# Patient Record
Sex: Male | Born: 1980
Health system: Southern US, Community
[De-identification: ages and names within clinical notes are randomized; demographics above are authoritative.]

## PROBLEM LIST (undated history)

## (undated) DIAGNOSIS — F5101 Primary insomnia: Secondary | ICD-10-CM

## (undated) DIAGNOSIS — R0602 Shortness of breath: Secondary | ICD-10-CM

## (undated) DIAGNOSIS — M4712 Other spondylosis with myelopathy, cervical region: Secondary | ICD-10-CM

## (undated) DIAGNOSIS — M4722 Other spondylosis with radiculopathy, cervical region: Secondary | ICD-10-CM

## (undated) DIAGNOSIS — F411 Generalized anxiety disorder: Secondary | ICD-10-CM

## (undated) HISTORY — DX: Primary insomnia: F51.01

## (undated) HISTORY — DX: Shortness of breath: R06.02

## (undated) HISTORY — DX: Other spondylosis with myelopathy, cervical region: M47.12

## (undated) HISTORY — DX: Generalized anxiety disorder: F41.1

## (undated) HISTORY — DX: Other spondylosis with radiculopathy, cervical region: M47.22

---

## 2008-11-28 ENCOUNTER — Emergency Department (HOSPITAL_COMMUNITY): Admission: EM | Admit: 2008-11-28 | Discharge: 2008-11-28 | Payer: Self-pay | Admitting: Emergency Medicine

## 2009-11-30 ENCOUNTER — Emergency Department (HOSPITAL_COMMUNITY): Admission: EM | Admit: 2009-11-30 | Discharge: 2009-11-30 | Payer: Self-pay | Admitting: Emergency Medicine

## 2012-06-30 ENCOUNTER — Ambulatory Visit
Admission: RE | Admit: 2012-06-30 | Discharge: 2012-06-30 | Disposition: A | Discharge status: Home / Self Care | Payer: BC Managed Care – PPO | Source: Ambulatory Visit | Attending: Family Medicine | Admitting: Family Medicine

## 2012-06-30 ENCOUNTER — Other Ambulatory Visit: Payer: Self-pay | Admitting: Family Medicine

## 2012-06-30 DIAGNOSIS — M549 Dorsalgia, unspecified: Secondary | ICD-10-CM

## 2012-10-08 ENCOUNTER — Other Ambulatory Visit (HOSPITAL_COMMUNITY): Payer: Self-pay | Admitting: Orthopedic Surgery

## 2012-10-08 DIAGNOSIS — IMO0001 Reserved for inherently not codable concepts without codable children: Secondary | ICD-10-CM

## 2012-10-08 DIAGNOSIS — G950 Syringomyelia and syringobulbia: Secondary | ICD-10-CM

## 2012-10-13 ENCOUNTER — Ambulatory Visit (HOSPITAL_COMMUNITY)
Admission: RE | Admit: 2012-10-13 | Discharge: 2012-10-13 | Disposition: A | Discharge status: Home / Self Care | Payer: Self-pay | Source: Ambulatory Visit | Attending: Orthopedic Surgery | Admitting: Orthopedic Surgery

## 2012-10-13 DIAGNOSIS — M519 Unspecified thoracic, thoracolumbar and lumbosacral intervertebral disc disorder: Secondary | ICD-10-CM | POA: Insufficient documentation

## 2012-10-13 DIAGNOSIS — IMO0001 Reserved for inherently not codable concepts without codable children: Secondary | ICD-10-CM

## 2012-10-13 DIAGNOSIS — M47812 Spondylosis without myelopathy or radiculopathy, cervical region: Secondary | ICD-10-CM | POA: Insufficient documentation

## 2012-10-13 DIAGNOSIS — G950 Syringomyelia and syringobulbia: Secondary | ICD-10-CM

## 2012-10-13 DIAGNOSIS — G9589 Other specified diseases of spinal cord: Secondary | ICD-10-CM | POA: Insufficient documentation

## 2013-02-22 DIAGNOSIS — M47812 Spondylosis without myelopathy or radiculopathy, cervical region: Secondary | ICD-10-CM | POA: Insufficient documentation

## 2017-03-09 ENCOUNTER — Encounter: Payer: Self-pay | Admitting: Physician Assistant

## 2017-03-09 ENCOUNTER — Ambulatory Visit (INDEPENDENT_AMBULATORY_CARE_PROVIDER_SITE_OTHER): Payer: 59 | Admitting: Physician Assistant

## 2017-03-09 VITALS — BP 146/96 | HR 97 | Ht 74.0 in | Wt 245.0 lb

## 2017-03-09 DIAGNOSIS — M4712 Other spondylosis with myelopathy, cervical region: Secondary | ICD-10-CM | POA: Diagnosis not present

## 2017-03-09 DIAGNOSIS — R030 Elevated blood-pressure reading, without diagnosis of hypertension: Secondary | ICD-10-CM | POA: Diagnosis not present

## 2017-03-09 DIAGNOSIS — M4802 Spinal stenosis, cervical region: Secondary | ICD-10-CM | POA: Diagnosis not present

## 2017-03-09 DIAGNOSIS — F5101 Primary insomnia: Secondary | ICD-10-CM

## 2017-03-09 DIAGNOSIS — F411 Generalized anxiety disorder: Secondary | ICD-10-CM

## 2017-03-09 DIAGNOSIS — R0602 Shortness of breath: Secondary | ICD-10-CM

## 2017-03-09 DIAGNOSIS — G4700 Insomnia, unspecified: Secondary | ICD-10-CM | POA: Insufficient documentation

## 2017-03-09 DIAGNOSIS — M4722 Other spondylosis with radiculopathy, cervical region: Secondary | ICD-10-CM

## 2017-03-09 HISTORY — DX: Generalized anxiety disorder: F41.1

## 2017-03-09 HISTORY — DX: Shortness of breath: R06.02

## 2017-03-09 HISTORY — DX: Other spondylosis with myelopathy, cervical region: M47.12

## 2017-03-09 HISTORY — DX: Primary insomnia: F51.01

## 2017-03-09 MED ORDER — FLUOXETINE HCL 20 MG PO TABS: 20.0000 mg | ORAL_TABLET | Freq: Every day | ORAL | 1 refills | Status: DC

## 2017-03-09 MED ORDER — TRAZODONE HCL 50 MG PO TABS: 25.0000 mg | ORAL_TABLET | Freq: Every evening | ORAL | 1 refills | Status: DC | PRN

## 2017-03-09 MED ORDER — TRAMADOL HCL 50 MG PO TABS: 100.0000 mg | ORAL_TABLET | Freq: Two times a day (BID) | ORAL | 2 refills | Status: DC | PRN

## 2017-03-09 MED ORDER — MELOXICAM 15 MG PO TABS: 15.0000 mg | ORAL_TABLET | Freq: Every day | ORAL | 2 refills | Status: DC

## 2017-03-09 MED ORDER — PREDNISONE 20 MG PO TABS: ORAL_TABLET | ORAL | 0 refills | Status: DC

## 2017-03-09 NOTE — Patient Instructions (Addendum)
DASH Eating Plan DASH stands for "Dietary Approaches to Stop Hypertension." The DASH eating plan is a healthy eating plan that has been shown to reduce high blood pressure (hypertension). It may also reduce your risk for type 2 diabetes, heart disease, and stroke. The DASH eating plan may also help with weight loss. What are tips for following this plan? General guidelines   Avoid eating more than 2,300 mg (milligrams) of salt (sodium) a day. If you have hypertension, you may need to reduce your sodium intake to 1,500 mg a day.  Limit alcohol intake to no more than 1 drink a day for nonpregnant women and 2 drinks a day for men. One drink equals 12 oz of beer, 5 oz of wine, or 1 oz of hard liquor.  Work with your health care provider to maintain a healthy body weight or to lose weight. Ask what an ideal weight is for you.  Get at least 30 minutes of exercise that causes your heart to beat faster (aerobic exercise) most days of the week. Activities may include walking, swimming, or biking.  Work with your health care provider or diet and nutrition specialist (dietitian) to adjust your eating plan to your individual calorie needs. Reading food labels   Check food labels for the amount of sodium per serving. Choose foods with less than 5 percent of the Daily Value of sodium. Generally, foods with less than 300 mg of sodium per serving fit into this eating plan.  To find whole grains, look for the word "whole" as the first word in the ingredient list. Shopping   Buy products labeled as "low-sodium" or "no salt added."  Buy fresh foods. Avoid canned foods and premade or frozen meals. Cooking   Avoid adding salt when cooking. Use salt-free seasonings or herbs instead of table salt or sea salt. Check with your health care provider or pharmacist before using salt substitutes.  Do not fry foods. Cook foods using healthy methods such as baking, boiling, grilling, and broiling instead.  Cook with  heart-healthy oils, such as olive, canola, soybean, or sunflower oil. Meal planning    Eat a balanced diet that includes:  5 or more servings of fruits and vegetables each day. At each meal, try to fill half of your plate with fruits and vegetables.  Up to 6-8 servings of whole grains each day.  Less than 6 oz of lean meat, poultry, or fish each day. A 3-oz serving of meat is about the same size as a deck of cards. One egg equals 1 oz.  2 servings of low-fat dairy each day.  A serving of nuts, seeds, or beans 5 times each week.  Heart-healthy fats. Healthy fats called Omega-3 fatty acids are found in foods such as flaxseeds and coldwater fish, like sardines, salmon, and mackerel.  Limit how much you eat of the following:  Canned or prepackaged foods.  Food that is high in trans fat, such as fried foods.  Food that is high in saturated fat, such as fatty meat.  Sweets, desserts, sugary drinks, and other foods with added sugar.  Full-fat dairy products.  Do not salt foods before eating.  Try to eat at least 2 vegetarian meals each week.  Eat more home-cooked food and less restaurant, buffet, and fast food.  When eating at a restaurant, ask that your food be prepared with less salt or no salt, if possible. What foods are recommended? The items listed may not be a complete list. Talk   with your dietitian about what dietary choices are best for you. Grains  Whole-grain or whole-wheat bread. Whole-grain or whole-wheat pasta. Brown rice. [place]row. Bulgur. Whole-grain and low-sodium cereals. Pita bread. Low-fat, low-sodium crackers. Whole-wheat flour tortillas. Vegetables  Fresh or frozen vegetables (raw, steamed, roasted, or grilled). Low-sodium or reduced-sodium tomato and vegetable juice. Low-sodium or reduced-sodium tomato sauce and tomato paste. Low-sodium or reduced-sodium canned vegetables. Fruits  All fresh, dried, or frozen fruit. Canned fruit in natural juice  (without added sugar). Meat and other protein foods  Skinless chicken or [place]. Ground chicken or [place]. Pork with fat trimmed off. Fish and seafood. Egg whites. Dried beans, peas, or lentils. Unsalted nuts, nut butters, and seeds. Unsalted canned beans. Lean cuts of beef with fat trimmed off. Low-sodium, lean deli meat. Dairy  Low-fat (1%) or fat-free (skim) milk. Fat-free, low-fat, or reduced-fat cheeses. Nonfat, low-sodium ricotta or cottage cheese. Low-fat or nonfat yogurt. Low-fat, low-sodium cheese. Fats and oils  Soft margarine without trans fats. Vegetable oil. Low-fat, reduced-fat, or light mayonnaise and salad dressings (reduced-sodium). Canola, safflower, olive, soybean, and sunflower oils. Avocado. Seasoning and other foods  Herbs. Spices. Seasoning mixes without salt. Unsalted popcorn and pretzels. Fat-free sweets. What foods are not recommended? The items listed may not be a complete list. Talk with your dietitian about what dietary choices are best for you. Grains  Baked goods made with fat, such as croissants, muffins, or some breads. Dry pasta or rice meal packs. Vegetables  Creamed or fried vegetables. Vegetables in a cheese sauce. Regular canned vegetables (not low-sodium or reduced-sodium). Regular canned tomato sauce and paste (not low-sodium or reduced-sodium). Regular tomato and vegetable juice (not low-sodium or reduced-sodium). Angie Fava. Olives. Fruits  Canned fruit in a light or heavy syrup. Fried fruit. Fruit in cream or butter sauce. Meat and other protein foods  Fatty cuts of meat. Ribs. Fried meat. Berniece Salines. Sausage. Bologna and other processed lunch meats. Salami. Fatback. Hotdogs. Bratwurst. Salted nuts and seeds. Canned beans with added salt. Canned or smoked fish. Whole eggs or egg yolks. Chicken or [place] with skin. Dairy  Whole or 2% milk, cream, and half-and-half. Whole or full-fat cream cheese. Whole-fat or sweetened yogurt. Full-fat cheese. Nondairy creamers.  Whipped toppings. Processed cheese and cheese spreads. Fats and oils  Butter. Stick margarine. Lard. Shortening. Ghee. Bacon fat. Tropical oils, such as coconut, palm kernel, or palm oil. Seasoning and other foods  Salted popcorn and pretzels. Onion salt, garlic salt, seasoned salt, table salt, and sea salt. Worcestershire sauce. Tartar sauce. Barbecue sauce. Teriyaki sauce. Soy sauce, including reduced-sodium. Steak sauce. Canned and packaged gravies. Fish sauce. Oyster sauce. Cocktail sauce. Horseradish that you find on the shelf. Ketchup. Mustard. Meat flavorings and tenderizers. Bouillon cubes. Hot sauce and Tabasco sauce. Premade or packaged marinades. Premade or packaged taco seasonings. Relishes. Regular salad dressings. Where to find more information:  National Heart, Lung, and [place]: https://wilson-eaton.com/  American Heart Association: www.heart.org Summary  The DASH eating plan is a healthy eating plan that has been shown to reduce high blood pressure (hypertension). It may also reduce your risk for type 2 diabetes, heart disease, and stroke.  With the DASH eating plan, you should limit salt (sodium) intake to 2,300 mg a day. If you have hypertension, you may need to reduce your sodium intake to 1,500 mg a day.  When on the DASH eating plan, aim to eat more fresh fruits and vegetables, whole grains, lean proteins, low-fat dairy, and heart-healthy fats.  Work  with your health care provider or diet and nutrition specialist (dietitian) to adjust your eating plan to your individual calorie needs. This information is not intended to replace advice given to you by your health care provider. Make sure you discuss any questions you have with your health care provider. Document Released: 10/09/2011 Document Revised: 10/13/2016 Document Reviewed: 10/13/2016 Elsevier Interactive Patient Education  2017 [employer].   Will order new MRI.

## 2017-03-09 NOTE — Progress Notes (Signed)
Subjective:    Patient ID: Thomas Jennings, male    DOB: April 15, 1981, 36 y.o.   MRN: [id number]  HPI  Pt is a 36 yo male who presents to the clinic to establish care. He has not had insurance in a while and just got it and needs management.   He does has a PMH of cervical radiculopathy, upper extermity weakness. Nothing was ever done to treat. He has not had insurance since 2013.   Most concerning problem is bilateral upper extermity weakness, numbness, tingling, pain. Started in 2010 without any known injury. He was seen in 2013 and had a surgical consult but lost insurance. He has not been taking anything but up to 2000mg  of advil a day for symptoms. Symptoms come and go. Worse with simple task such as folding laundry or just leaning forward. Pt has to lift heavy equipment and pain is terrible at times.   He also mentions at times feeling short of breath at rest. He stopped smoking 8 years ago. He denies any cough, wheezing. He is also very anxious and cannot sleep. Pain effects sleep as well.    Review of Systems    see HPI>  Objective:   Physical Exam  Constitutional: He is oriented to person, place, and time. He appears well-developed and well-nourished.  HENT:  Head: Normocephalic and atraumatic.  Cardiovascular: Normal rate, regular rhythm and normal heart sounds.   Pulmonary/Chest: Effort normal and breath sounds normal.  Musculoskeletal:  Pain with bilateral ROM of arms.  Discomfort with cspine palpation.  Strength decreased 4/5.  spurlings positive bilateral with right worse than left.   Neurological: He is alert and oriented to person, place, and time.  Psychiatric: He has a normal mood and affect. His behavior is normal.          Assessment & Plan:  [name] KitchenMarland KitchenDiagnoses and all orders for this visit:  Cervical spinal stenosis -     meloxicam (MOBIC) 15 MG tablet; Take 1 tablet (15 mg total) by mouth daily. -     predniSONE (DELTASONE) 20 MG tablet; Take 3 tablets for 3  days, take 2 tablets for 3 days, take 1 tablet for 3 days, take 1/2 tablet for 4 days. -     traMADol (ULTRAM) 50 MG tablet; Take 2 tablets (100 mg total) by mouth every 12 (twelve) hours as needed. -     MR CERVICAL SPINE WO CONTRAST; Future  Cervical spondylosis with myelopathy and radiculopathy -     meloxicam (MOBIC) 15 MG tablet; Take 1 tablet (15 mg total) by mouth daily. -     predniSONE (DELTASONE) 20 MG tablet; Take 3 tablets for 3 days, take 2 tablets for 3 days, take 1 tablet for 3 days, take 1/2 tablet for 4 days. -     traMADol (ULTRAM) 50 MG tablet; Take 2 tablets (100 mg total) by mouth every 12 (twelve) hours as needed. -     MR CERVICAL SPINE WO CONTRAST; Future  Elevated blood pressure reading  SOB (shortness of breath)  GAD (generalized anxiety disorder) -     FLUoxetine (PROZAC) 20 MG tablet; Take 1 tablet (20 mg total) by mouth daily.  Primary insomnia -     traZODone (DESYREL) 50 MG tablet; Take 0.5-1 tablets (25-50 mg total) by mouth at bedtime as needed for sleep.   2013 MRI reads right sided neural encroachment at C4-5 and C5-6 due to disc material and bony overgrowth with spinal stenosis and cord flattening.  Spondylosis at C6 and 7 with shallow central protrusion and endplate reative changes. Possible bilateral c7 nerve root encroachment.   Will get new MRI and make referral to neurosurgeon likely.   Will continue to watch BP follow up in 4 weeks.   .. Depression screen [facility] 2/9 03/09/2017 03/09/2017  Decreased Interest 1 1  Down, Depressed, Hopeless 1 1  PHQ - 2 Score 2 2  Altered sleeping 3 -  Tired, decreased energy 3 -  Change in appetite 2 -  Feeling bad or failure about yourself  1 -  Trouble concentrating 3 -  Moving slowly or fidgety/restless 0 -  Suicidal thoughts 0 -  PHQ-9 Score 14 -   .[name] GAD 7 : Generalized Anxiety Score 03/09/2017  Nervous, Anxious, on Edge 3  Control/stop worrying 3  Worry too much - different things 3  Trouble relaxing  3  Restless 3  Easily annoyed or irritable 2  Afraid - awful might happen 1  Total GAD 7 Score 18    Started prozac. Discussed side effects. Follow up in 4 weeks.  For sleep started trazodone.

## 2017-03-11 ENCOUNTER — Ambulatory Visit: Payer: Self-pay | Admitting: Physician Assistant

## 2017-04-13 ENCOUNTER — Ambulatory Visit: Payer: 59 | Admitting: Physician Assistant

## 2017-04-22 ENCOUNTER — Ambulatory Visit: Payer: 59 | Admitting: Physician Assistant

## 2017-04-23 ENCOUNTER — Other Ambulatory Visit: Payer: Self-pay | Admitting: *Deleted

## 2017-04-23 DIAGNOSIS — M4802 Spinal stenosis, cervical region: Secondary | ICD-10-CM

## 2017-04-23 DIAGNOSIS — M4712 Other spondylosis with myelopathy, cervical region: Secondary | ICD-10-CM

## 2017-04-23 DIAGNOSIS — M4722 Other spondylosis with radiculopathy, cervical region: Secondary | ICD-10-CM

## 2017-04-23 MED ORDER — TRAMADOL HCL 50 MG PO TABS: 100.0000 mg | ORAL_TABLET | Freq: Two times a day (BID) | ORAL | 1 refills | Status: DC | PRN

## 2017-04-29 ENCOUNTER — Other Ambulatory Visit: Payer: Self-pay | Admitting: Physician Assistant

## 2017-04-29 DIAGNOSIS — M4802 Spinal stenosis, cervical region: Secondary | ICD-10-CM

## 2017-04-29 DIAGNOSIS — M4712 Other spondylosis with myelopathy, cervical region: Secondary | ICD-10-CM

## 2017-04-29 DIAGNOSIS — M4722 Other spondylosis with radiculopathy, cervical region: Secondary | ICD-10-CM

## 2017-05-04 ENCOUNTER — Other Ambulatory Visit: Payer: Self-pay | Admitting: *Deleted

## 2017-05-04 ENCOUNTER — Ambulatory Visit: Payer: 59 | Admitting: Physician Assistant

## 2017-05-04 ENCOUNTER — Other Ambulatory Visit: Payer: Self-pay | Admitting: Physician Assistant

## 2017-05-04 DIAGNOSIS — Z0189 Encounter for other specified special examinations: Secondary | ICD-10-CM

## 2017-05-04 DIAGNOSIS — M4722 Other spondylosis with radiculopathy, cervical region: Secondary | ICD-10-CM

## 2017-05-04 DIAGNOSIS — M4712 Other spondylosis with myelopathy, cervical region: Secondary | ICD-10-CM

## 2017-05-04 DIAGNOSIS — M4802 Spinal stenosis, cervical region: Secondary | ICD-10-CM

## 2017-05-04 DIAGNOSIS — F5101 Primary insomnia: Secondary | ICD-10-CM

## 2017-05-04 MED ORDER — TRAMADOL HCL 50 MG PO TABS: 100.0000 mg | ORAL_TABLET | Freq: Two times a day (BID) | ORAL | 1 refills | Status: DC | PRN

## 2017-05-11 ENCOUNTER — Encounter: Payer: Self-pay | Admitting: Physician Assistant

## 2017-05-11 ENCOUNTER — Ambulatory Visit (INDEPENDENT_AMBULATORY_CARE_PROVIDER_SITE_OTHER): Payer: 59 | Admitting: Physician Assistant

## 2017-05-11 VITALS — BP 156/90 | HR 93 | Wt 231.0 lb

## 2017-05-11 DIAGNOSIS — I10 Essential (primary) hypertension: Secondary | ICD-10-CM | POA: Diagnosis not present

## 2017-05-11 DIAGNOSIS — F411 Generalized anxiety disorder: Secondary | ICD-10-CM

## 2017-05-11 DIAGNOSIS — F99 Mental disorder, not otherwise specified: Secondary | ICD-10-CM

## 2017-05-11 DIAGNOSIS — F5105 Insomnia due to other mental disorder: Secondary | ICD-10-CM

## 2017-05-11 MED ORDER — CITALOPRAM HYDROBROMIDE 20 MG PO TABS: 20.0000 mg | ORAL_TABLET | Freq: Every day | ORAL | 3 refills | Status: DC

## 2017-05-11 MED ORDER — HYDROXYZINE HCL 25 MG PO TABS: 25.0000 mg | ORAL_TABLET | Freq: Every evening | ORAL | 3 refills | Status: DC | PRN

## 2017-05-11 NOTE — Progress Notes (Signed)
HPI:                                                                Thomas Jennings is a 36 y.o. male who presents to [facility] today for blood pressure follow-up  Patient had an elevated blood pressure reading at his last visit with his PCP, Iran Planas PA-C. Has been monitoring BP's intermittently at [facility]. BP range 140's/90's. Denies vision change, headache, chest pain with exertion, orthopnea, lightheadedness, syncope and edema. Risk factors include: former tobacco use  GAD: was prescribed Fluoxetine, but states he could not afford it. 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 He reports anxiety related to childhood trauma (father was an alcoholic) and currently being separated from his daughter and her mother. He does have visitation with his daughter on the weekends. He continues to endorse difficulty initiating and maintaining sleep. Denies symptoms of mania/hypomania. Denies suicidal thinking. Denies auditory/visual hallucinations. Denies substance abuse.   Past Medical History:  Diagnosis Date  . Cervical spondylosis with myelopathy and radiculopathy 03/09/2017  . GAD (generalized anxiety disorder) 03/09/2017  . Primary insomnia 03/09/2017  . SOB (shortness of breath) 03/09/2017   No past surgical history on file. Social History  Substance Use Topics  . Smoking status: Former Research scientist (life sciences)  . Smokeless tobacco: Never Used  . Alcohol use Yes   family history includes Alcohol abuse in his father; Dementia in his father; Mental illness in his mother.  ROS: negative except as noted in the HPI  Medications: Current Outpatient Prescriptions  Medication Sig Dispense Refill  . citalopram (CELEXA) 20 MG tablet Take 1 tablet (20 mg total) by mouth daily. 30 tablet 3  . hydrOXYzine (ATARAX/VISTARIL) 25 MG tablet Take 1 tablet (25 mg total) by mouth at bedtime as  needed for anxiety. For itching. 30 tablet 3  . meloxicam (MOBIC) 15 MG tablet Take 1 tablet (15 mg total) by mouth daily. 30 tablet 2  . traMADol (ULTRAM) 50 MG tablet Take 2 tablets (100 mg total) by mouth every 12 (twelve) hours as needed. 60 tablet 1   No current facility-administered medications for this visit.    No Known Allergies   Objective:  BP (!) 156/90   Pulse 93   Wt 231 lb (104.8 kg)   BMI 29.66 kg/m  Gen: well-groomed, cooperative, not ill-appearing, no distress Pulm: Normal work of breathing, normal phonation, clear to auscultation bilaterally CV: Normal rate, regular rhythm, s1 and s2 distinct, no murmurs, clicks or rubs, no carotid bruit Neuro: alert and oriented x 3, EOM's intact, no tremor MSK: extremities atraumatic, normal gait and station, no peripheral edema Skin: warm, dry, intact; no rashes or lesions on exposed skin, no cyanosis Psych: good eye contact, anxious affect, depressed mood, normal speech and thought content  No results found for this or any previous visit (from the past 72 hour(s)). No results found.    Assessment and Plan: 36 y.o. male with   1. GAD (generalized anxiety disorder) - switching to Celexa on the [facility] list. Titrating with 10mg  week 1, then 20 mg - referred to [facility] [place] for CBT - citalopram (CELEXA) 20  MG tablet; Take 1 tablet (20 mg total) by mouth daily.  Dispense: 30 tablet; Refill: 3  2. Insomnia due to other mental disorder - discontinuing Trazodone. Patient is on Tramadol, so will avoid benzodiazepine - Hydroxyzine QHS prn - sleep hygiene - hydrOXYzine (ATARAX/VISTARIL) 25 MG tablet; Take 1 tablet (25 mg total) by mouth at bedtime as needed for anxiety. For itching.  Dispense: 30 tablet; Refill: 3  3. Hypertension goal BP (blood pressure) < 130/80 - patient declines medication today and would like to trial aggressive lifestyle modification - TLC, including moderate CV exercise, weight loss,  and DASH eating plan   Patient education and anticipatory guidance given Patient agrees with treatment plan Follow-up in 4 weeks or sooner as needed if symptoms worsen or fail to improve  Darlyne Russian PA-C

## 2017-05-11 NOTE — Patient Instructions (Addendum)
For blood pressure: - DASH eating plan - increase cardiovascular exercise (see below) - aggressive diet and exercise for 1 month  For anxiety: - start Citalopram 1/2 tablet every morning for 1 week, then increase to the full tablet every morning - Hydroxyzine as needed at bedtime for anxiety/insomnia. Give yourself at least 8 hours for sleep - Sleep hygiene - Follow-up with [employer], [employer] . Limiting daytime naps to 30 minutes . Napping does not make up for inadequate nighttime sleep. However, a short nap of 20-30 minutes can help to improve mood, alertness and performance.  . Avoiding stimulants such as  caffeine and nicotine close to bedtime.  And when it comes to alcohol, moderation is key 4. While alcohol is well-known to help you fall asleep faster, too much close to bedtime can disrupt sleep in the second half of the night as the body begins to process the alcohol.    . Exercising to promote good quality sleep.  As little as 10 minutes of aerobic exercise, such as walking or cycling, can drastically improve nighttime sleep quality.  For the best night's sleep, most people should avoid strenuous workouts close to bedtime. However, the effect of intense nighttime exercise on sleep differs from person to person, so find out what works best for you.   . Steering clear of food that can be disruptive right before sleep.   Heavy or rich foods, fatty or fried meals, spicy dishes, citrus fruits, and carbonated drinks can trigger indigestion for some people. When this occurs close to bedtime, it can lead to painful heartburn that disrupts sleep. . Ensuring adequate exposure to natural light.  This is particularly important for individuals who may not venture outside frequently. Exposure to sunlight during the day, as well as darkness at night, helps to maintain a healthy sleep-wake cycle . [name] Establishing a regular relaxing bedtime routine.  A regular nightly routine helps the  body recognize that it is bedtime. This could include taking warm shower or bath, reading a book, or light stretches. When possible, try to avoid emotionally upsetting conversations and activities before attempting to sleep. . Making sure that the sleep environment is pleasant.  Mattress and pillows should be comfortable. The bedroom should be cool - between 60 and 67 degrees - for optimal sleep. Bright light from lamps, cell phone and TV screens can make it difficult to fall asleep4, so turn those light off or adjust them when possible. Consider using blackout curtains, eye shades, ear plugs, "white noise" machines, humidifiers, fans and other devices that can make the bedroom more relaxing.  Physical Activity Recommendations for modifying lipids and lowering blood pressure Engage in aerobic physical activity to reduce LDL-cholesterol, non-HDL-cholesterol, and blood pressure  Frequency: 3-4 sessions per week  Intensity: moderate to vigorous  Duration: 40 minutes on average  Physical Activity Recommendations for secondary prevention 1. Aerobic exercise  Frequency: 3-5 sessions per week  Intensity: 50-80% capacity  Duration: 20 - 60 minutes  Examples: walking, treadmill, cycling, rowing, stair climbing, and arm/leg ergometry  2. Resistance exercise  Frequency: 2-3 sessions per week  Intensity: 10-15 repetitions/set to moderate fatigue  Duration: 1-3 sets of 8-10 upper and lower body exercises  Examples: calisthenics, elastic bands, cuff/hand weights, dumbbels, free weights, wall pulleys, and weight machines  Heart-Healthy Lifestyle  Eating a diet rich in vegetables, fruits and whole grains: also includes low-fat dairy products, poultry, fish, legumes, and nuts; limit intake of sweets, sugar-sweetened beverages and red  meats  Getting regular exercise  Maintaining a healthy weight  Not smoking or getting help quitting  Staying on top of your health; for some people, lifestyle  changes alone may not be enough to prevent a heart attack or stroke. In these cases, taking a statin at the right dose will most likely be necessary   DASH Eating Plan DASH stands for "Dietary Approaches to Stop Hypertension." The DASH eating plan is a healthy eating plan that has been shown to reduce high blood pressure (hypertension). It may also reduce your risk for type 2 diabetes, heart disease, and stroke. The DASH eating plan may also help with weight loss. What are tips for following this plan? General guidelines  Avoid eating more than 2,300 mg (milligrams) of salt (sodium) a day. If you have hypertension, you may need to reduce your sodium intake to 1,500 mg a day.  Limit alcohol intake to no more than 1 drink a day for nonpregnant women and 2 drinks a day for men. One drink equals 12 oz of beer, 5 oz of wine, or 1 oz of hard liquor.  Work with your health care provider to maintain a healthy body weight or to lose weight. Ask what an ideal weight is for you.  Get at least 30 minutes of exercise that causes your heart to beat faster (aerobic exercise) most days of the week. Activities may include walking, swimming, or biking.  Work with your health care provider or diet and nutrition specialist (dietitian) to adjust your eating plan to your individual calorie needs. Reading food labels  Check food labels for the amount of sodium per serving. Choose foods with less than 5 percent of the Daily Value of sodium. Generally, foods with less than 300 mg of sodium per serving fit into this eating plan.  To find whole grains, look for the word "whole" as the first word in the ingredient list. Shopping  Buy products labeled as "low-sodium" or "no salt added."  Buy fresh foods. Avoid canned foods and premade or frozen meals. Cooking  Avoid adding salt when cooking. Use salt-free seasonings or herbs instead of table salt or sea salt. Check with your health care provider or pharmacist  before using salt substitutes.  Do not fry foods. Cook foods using healthy methods such as baking, boiling, grilling, and broiling instead.  Cook with heart-healthy oils, such as olive, canola, soybean, or sunflower oil. Meal planning   Eat a balanced diet that includes: ? 5 or more servings of fruits and vegetables each day. At each meal, try to fill half of your plate with fruits and vegetables. ? Up to 6-8 servings of whole grains each day. ? Less than 6 oz of lean meat, poultry, or fish each day. A 3-oz serving of meat is about the same size as a deck of cards. One egg equals 1 oz. ? 2 servings of low-fat dairy each day. ? A serving of nuts, seeds, or beans 5 times each week. ? Heart-healthy fats. Healthy fats called Omega-3 fatty acids are found in foods such as flaxseeds and coldwater fish, like sardines, salmon, and mackerel.  Limit how much you eat of the following: ? Canned or prepackaged foods. ? Food that is high in trans fat, such as fried foods. ? Food that is high in saturated fat, such as fatty meat. ? Sweets, desserts, sugary drinks, and other foods with added sugar. ? Full-fat dairy products.  Do not salt foods before eating.  Try to  eat at least 2 vegetarian meals each week.  Eat more home-cooked food and less restaurant, buffet, and fast food.  When eating at a restaurant, ask that your food be prepared with less salt or no salt, if possible. What foods are recommended? The items listed may not be a complete list. Talk with your dietitian about what dietary choices are best for you. Grains Whole-grain or whole-wheat bread. Whole-grain or whole-wheat pasta. Brown rice. [place]. Bulgur. Whole-grain and low-sodium cereals. Pita bread. Low-fat, low-sodium crackers. Whole-wheat flour tortillas. Vegetables Fresh or frozen vegetables (raw, steamed, roasted, or grilled). Low-sodium or reduced-sodium tomato and vegetable juice. Low-sodium or reduced-sodium tomato  sauce and tomato paste. Low-sodium or reduced-sodium canned vegetables. Fruits All fresh, dried, or frozen fruit. Canned fruit in natural juice (without added sugar). Meat and other protein foods Skinless chicken or [place]. Ground chicken or [place]. Pork with fat trimmed off. Fish and seafood. Egg whites. Dried beans, peas, or lentils. Unsalted nuts, nut butters, and seeds. Unsalted canned beans. Lean cuts of beef with fat trimmed off. Low-sodium, lean deli meat. Dairy Low-fat (1%) or fat-free (skim) milk. Fat-free, low-fat, or reduced-fat cheeses. Nonfat, low-sodium ricotta or cottage cheese. Low-fat or nonfat yogurt. Low-fat, low-sodium cheese. Fats and oils Soft margarine without trans fats. Vegetable oil. Low-fat, reduced-fat, or light mayonnaise and salad dressings (reduced-sodium). Canola, safflower, olive, soybean, and sunflower oils. Avocado. Seasoning and other foods Herbs. Spices. Seasoning mixes without salt. Unsalted popcorn and pretzels. Fat-free sweets. What foods are not recommended? The items listed may not be a complete list. Talk with your dietitian about what dietary choices are best for you. Grains Baked goods made with fat, such as croissants, muffins, or some breads. Dry pasta or rice meal packs. Vegetables Creamed or fried vegetables. Vegetables in a cheese sauce. Regular canned vegetables (not low-sodium or reduced-sodium). Regular canned tomato sauce and paste (not low-sodium or reduced-sodium). Regular tomato and vegetable juice (not low-sodium or reduced-sodium). Angie Fava. Olives. Fruits Canned fruit in a light or heavy syrup. Fried fruit. Fruit in cream or butter sauce. Meat and other protein foods Fatty cuts of meat. Ribs. Fried meat. Berniece Salines. Sausage. Bologna and other processed lunch meats. Salami. Fatback. Hotdogs. Bratwurst. Salted nuts and seeds. Canned beans with added salt. Canned or smoked fish. Whole eggs or egg yolks. Chicken or [place] with skin. Dairy Whole  or 2% milk, cream, and half-and-half. Whole or full-fat cream cheese. Whole-fat or sweetened yogurt. Full-fat cheese. Nondairy creamers. Whipped toppings. Processed cheese and cheese spreads. Fats and oils Butter. Stick margarine. Lard. Shortening. Ghee. Bacon fat. Tropical oils, such as coconut, palm kernel, or palm oil. Seasoning and other foods Salted popcorn and pretzels. Onion salt, garlic salt, seasoned salt, table salt, and sea salt. Worcestershire sauce. Tartar sauce. Barbecue sauce. Teriyaki sauce. Soy sauce, including reduced-sodium. Steak sauce. Canned and packaged gravies. Fish sauce. Oyster sauce. Cocktail sauce. Horseradish that you find on the shelf. Ketchup. Mustard. Meat flavorings and tenderizers. Bouillon cubes. Hot sauce and Tabasco sauce. Premade or packaged marinades. Premade or packaged taco seasonings. Relishes. Regular salad dressings. Where to find more information:  National Heart, Lung, and [place]: https://wilson-eaton.com/  American Heart Association: www.heart.org Summary  The DASH eating plan is a healthy eating plan that has been shown to reduce high blood pressure (hypertension). It may also reduce your risk for type 2 diabetes, heart disease, and stroke.  With the DASH eating plan, you should limit salt (sodium) intake to 2,300 mg a day. If you have  hypertension, you may need to reduce your sodium intake to 1,500 mg a day.  When on the DASH eating plan, aim to eat more fresh fruits and vegetables, whole grains, lean proteins, low-fat dairy, and heart-healthy fats.  Work with your health care provider or diet and nutrition specialist (dietitian) to adjust your eating plan to your individual calorie needs. This information is not intended to replace advice given to you by your health care provider. Make sure you discuss any questions you have with your health care provider. Document Released: 10/09/2011 Document Revised: 10/13/2016 Document Reviewed:  10/13/2016 Elsevier Interactive Patient Education  2017 [employer].

## 2017-06-11 ENCOUNTER — Telehealth: Payer: Self-pay

## 2017-06-11 ENCOUNTER — Ambulatory Visit: Payer: 59 | Admitting: Physician Assistant

## 2017-06-11 NOTE — Telephone Encounter (Signed)
He has multilevel degenerative changes in his cervical spine, I'd be happy to see him to offer an interventional solution, he can also discuss this with his surgeon Dr. Daylene Katayama, but I don't want to just keep covering this up with tramadol.

## 2017-06-11 NOTE — Telephone Encounter (Signed)
Patient called requested a refill for Tramadol. He stated that he is taking more than the instructions stated, he stated that he has gotten his last refill already.  The medication is lasting him about 2 weeks at a time.He stated that he is having neck pain and shoulder pain everyday when he gets home from work. Please advise if refill is appropriate. Rhonda Cunningham,CMA

## 2017-06-11 NOTE — Telephone Encounter (Signed)
Left  message on patient vm with information noted below. Advised patient to call office back when he gets the message. Latunya Kissick,CMA

## 2017-06-12 NOTE — Telephone Encounter (Signed)
Left message on patient vm to call back regarding this. Rhonda Cunningham,CMA  

## 2017-06-12 NOTE — Telephone Encounter (Signed)
Spoke to patient gave him information as noted below. Rhonda Cunningham,CMA

## 2017-06-29 ENCOUNTER — Other Ambulatory Visit: Payer: Self-pay | Admitting: Physician Assistant

## 2017-06-29 DIAGNOSIS — F5101 Primary insomnia: Secondary | ICD-10-CM

## 2017-07-09 DIAGNOSIS — K5730 Diverticulosis of large intestine without perforation or abscess without bleeding: Secondary | ICD-10-CM | POA: Diagnosis not present

## 2017-07-09 DIAGNOSIS — K5732 Diverticulitis of large intestine without perforation or abscess without bleeding: Secondary | ICD-10-CM | POA: Diagnosis not present

## 2017-07-09 DIAGNOSIS — R1032 Left lower quadrant pain: Secondary | ICD-10-CM | POA: Diagnosis not present

## 2017-07-09 DIAGNOSIS — R1031 Right lower quadrant pain: Secondary | ICD-10-CM | POA: Diagnosis not present

## 2017-07-09 DIAGNOSIS — I861 Scrotal varices: Secondary | ICD-10-CM | POA: Diagnosis not present

## 2017-08-05 ENCOUNTER — Encounter: Payer: Self-pay | Admitting: Physician Assistant

## 2017-08-05 ENCOUNTER — Ambulatory Visit (INDEPENDENT_AMBULATORY_CARE_PROVIDER_SITE_OTHER): Payer: 59 | Admitting: Physician Assistant

## 2017-08-05 VITALS — BP 148/93 | HR 73 | Ht 74.0 in | Wt 227.0 lb

## 2017-08-05 DIAGNOSIS — I10 Essential (primary) hypertension: Secondary | ICD-10-CM | POA: Diagnosis not present

## 2017-08-05 DIAGNOSIS — M4712 Other spondylosis with myelopathy, cervical region: Secondary | ICD-10-CM | POA: Diagnosis not present

## 2017-08-05 DIAGNOSIS — M4802 Spinal stenosis, cervical region: Secondary | ICD-10-CM | POA: Diagnosis not present

## 2017-08-05 DIAGNOSIS — R0789 Other chest pain: Secondary | ICD-10-CM

## 2017-08-05 DIAGNOSIS — K2100 Gastro-esophageal reflux disease with esophagitis, without bleeding: Secondary | ICD-10-CM

## 2017-08-05 DIAGNOSIS — F411 Generalized anxiety disorder: Secondary | ICD-10-CM | POA: Diagnosis not present

## 2017-08-05 DIAGNOSIS — Z8719 Personal history of other diseases of the digestive system: Secondary | ICD-10-CM

## 2017-08-05 DIAGNOSIS — F331 Major depressive disorder, recurrent, moderate: Secondary | ICD-10-CM

## 2017-08-05 DIAGNOSIS — M4722 Other spondylosis with radiculopathy, cervical region: Secondary | ICD-10-CM | POA: Diagnosis not present

## 2017-08-05 DIAGNOSIS — F5101 Primary insomnia: Secondary | ICD-10-CM | POA: Diagnosis not present

## 2017-08-05 MED ORDER — DULOXETINE HCL 30 MG PO CPEP: 30.0000 mg | ORAL_CAPSULE | Freq: Every day | ORAL | 1 refills | Status: DC

## 2017-08-05 MED ORDER — OMEPRAZOLE 40 MG PO CPDR: 40.0000 mg | DELAYED_RELEASE_CAPSULE | Freq: Every day | ORAL | 1 refills | Status: DC

## 2017-08-05 MED ORDER — LISINOPRIL 5 MG PO TABS: 5.0000 mg | ORAL_TABLET | Freq: Every day | ORAL | 1 refills | Status: DC

## 2017-08-05 MED ORDER — TRAMADOL HCL 50 MG PO TABS: 100.0000 mg | ORAL_TABLET | Freq: Two times a day (BID) | ORAL | 1 refills | Status: DC | PRN

## 2017-08-05 NOTE — Progress Notes (Signed)
Subjective:    Patient ID: Thomas Jennings, male    DOB: 25-Apr-1981, 36 y.o.   MRN: [id number]  HPI Pt is a 36 yo male with symptomatic cervical spinal stenosis who comes in for follow up and to discuss multiple problems.   He is most concerned about his mood. His daughter has not been allowed to see him via his ex wife. He has hired a Chief Executive Officer but still has not gone to court. This makes him sad and emotional. Pt is on celexa and has helped a lot but does not seem to be helping now.  He is having problems sleeping. He is having occasional chest tightness and pain at rest. Not related to exertion.   Pt just recently got insurance. He has daily symptoms from cervical spinal stenosis. He is ready for intervention. He continues to work daily with a very physical job.   He is having problems with acid reflux. He is taking OTC zantac but not helping.   He mentions having ER visit for abdominal pain and determining he had diverticulitis on 07/09/17. Symptoms have resolved.   .. Active Ambulatory Problems    Diagnosis Date Noted  . Cervical spinal stenosis 03/09/2017  . Cervical spondylosis with myelopathy and radiculopathy 03/09/2017  . Elevated blood pressure reading 03/09/2017  . SOB (shortness of breath) 03/09/2017  . GAD (generalized anxiety disorder) 03/09/2017  . Primary insomnia 03/09/2017  . Essential hypertension 05/11/2017  . History of diverticulitis 08/05/2017  . Atypical chest pain 08/05/2017  . Gastroesophageal reflux disease with esophagitis 08/05/2017  . Moderate episode of recurrent major depressive disorder ([place]) 08/05/2017   Resolved Ambulatory Problems    Diagnosis Date Noted  . No Resolved Ambulatory Problems   Past Medical History:  Diagnosis Date  . Cervical spondylosis with myelopathy and radiculopathy 03/09/2017  . GAD (generalized anxiety disorder) 03/09/2017  . Primary insomnia 03/09/2017  . SOB (shortness of breath) 03/09/2017      Review of Systems    negative  other than HPI.  Objective:   Physical Exam  Constitutional: He is oriented to person, place, and time. He appears well-developed and well-nourished.  HENT:  Head: Normocephalic and atraumatic.  Cardiovascular: Normal rate, regular rhythm and normal heart sounds.   Pulmonary/Chest: Effort normal and breath sounds normal.  Neurological: He is alert and oriented to person, place, and time.  Psychiatric: His behavior is normal.  Very tearful.           Assessment & Plan:  [name] KitchenMarland KitchenJaspal was seen today for hypertension.  Diagnoses and all orders for this visit:  Essential hypertension -     lisinopril (PRINIVIL,ZESTRIL) 5 MG tablet; Take 1 tablet (5 mg total) by mouth daily.  Cervical spinal stenosis -     traMADol (ULTRAM) 50 MG tablet; Take 2 tablets (100 mg total) by mouth every 12 (twelve) hours as needed. -     Ambulatory referral to Neurosurgery  Cervical spondylosis with myelopathy and radiculopathy -     DULoxetine (CYMBALTA) 30 MG capsule; Take 1 capsule (30 mg total) by mouth daily. -     traMADol (ULTRAM) 50 MG tablet; Take 2 tablets (100 mg total) by mouth every 12 (twelve) hours as needed. -     Ambulatory referral to Neurosurgery  GAD (generalized anxiety disorder) -     DULoxetine (CYMBALTA) 30 MG capsule; Take 1 capsule (30 mg total) by mouth daily.  Primary insomnia  Moderate episode of recurrent major depressive disorder ([name]) -  DULoxetine (CYMBALTA) 30 MG capsule; Take 1 capsule (30 mg total) by mouth daily.  Hypertension goal BP (blood pressure) < 130/80  History of diverticulitis  Gastroesophageal reflux disease with esophagitis -     omeprazole (PRILOSEC) 40 MG capsule; Take 1 capsule (40 mg total) by mouth daily.  Atypical chest pain    Depression screen [facility] 2/9 08/05/2017 03/09/2017 03/09/2017  Decreased Interest 1 1 1   Down, Depressed, Hopeless 1 1 1   PHQ - 2 Score 2 2 2   Altered sleeping 1 3 -  Tired, decreased energy 1 3 -  Change in appetite  2 2 -  Feeling bad or failure about yourself  0 1 -  Trouble concentrating 2 3 -  Moving slowly or fidgety/restless 0 0 -  Suicidal thoughts 0 0 -  PHQ-9 Score 8 14 -   Referral made for neurosurgery.  Tramadol for pain.  mobic refilled.   Stop celexa start cymbalta. Follow up in 4 weeks. Discussed side effects.   Start lisinopril for BP. Follow up in 4 weeks.   Start prilosec for GERD.

## 2017-08-05 NOTE — Patient Instructions (Addendum)
Stop celexa and start cymbalta.  Tramadol as needed for acute pain.  Continue mobic daily with food.  Start omeprazole daily in the morning.  Will make referral for neck.

## 2017-08-12 ENCOUNTER — Other Ambulatory Visit: Payer: Self-pay | Admitting: Physician Assistant

## 2017-08-12 DIAGNOSIS — M4712 Other spondylosis with myelopathy, cervical region: Secondary | ICD-10-CM

## 2017-08-12 DIAGNOSIS — M4802 Spinal stenosis, cervical region: Secondary | ICD-10-CM

## 2017-08-12 DIAGNOSIS — M4722 Other spondylosis with radiculopathy, cervical region: Secondary | ICD-10-CM

## 2017-08-15 ENCOUNTER — Ambulatory Visit (HOSPITAL_BASED_OUTPATIENT_CLINIC_OR_DEPARTMENT_OTHER)
Admission: RE | Admit: 2017-08-15 | Discharge: 2017-08-15 | Disposition: A | Discharge status: Home / Self Care | Payer: 59 | Source: Ambulatory Visit | Attending: Physician Assistant | Admitting: Physician Assistant

## 2017-08-15 DIAGNOSIS — M4712 Other spondylosis with myelopathy, cervical region: Secondary | ICD-10-CM | POA: Diagnosis not present

## 2017-08-15 DIAGNOSIS — M542 Cervicalgia: Secondary | ICD-10-CM | POA: Diagnosis not present

## 2017-08-15 DIAGNOSIS — M4722 Other spondylosis with radiculopathy, cervical region: Secondary | ICD-10-CM | POA: Diagnosis not present

## 2017-08-15 DIAGNOSIS — M4802 Spinal stenosis, cervical region: Secondary | ICD-10-CM | POA: Diagnosis not present

## 2017-08-15 DIAGNOSIS — R609 Edema, unspecified: Secondary | ICD-10-CM | POA: Insufficient documentation

## 2017-08-25 ENCOUNTER — Telehealth: Payer: Self-pay | Admitting: *Deleted

## 2017-08-25 NOTE — Telephone Encounter (Signed)
Thomas Jennings,   Dr. Sheppard Coil wanted to f/u on the referral sent for this patient since he has not heard back yet.

## 2017-09-15 ENCOUNTER — Other Ambulatory Visit: Payer: Self-pay

## 2017-09-15 ENCOUNTER — Other Ambulatory Visit: Payer: Self-pay | Admitting: Physician Assistant

## 2017-09-15 DIAGNOSIS — M4722 Other spondylosis with radiculopathy, cervical region: Secondary | ICD-10-CM

## 2017-09-15 DIAGNOSIS — M4712 Other spondylosis with myelopathy, cervical region: Secondary | ICD-10-CM

## 2017-09-15 DIAGNOSIS — M4802 Spinal stenosis, cervical region: Secondary | ICD-10-CM

## 2017-09-15 MED ORDER — TRAMADOL HCL 50 MG PO TABS: 100.0000 mg | ORAL_TABLET | Freq: Two times a day (BID) | ORAL | 1 refills | Status: DC | PRN

## 2017-10-05 ENCOUNTER — Ambulatory Visit: Payer: 59 | Admitting: Physician Assistant

## 2017-10-05 DIAGNOSIS — Z0189 Encounter for other specified special examinations: Secondary | ICD-10-CM

## 2017-10-22 ENCOUNTER — Other Ambulatory Visit: Payer: Self-pay | Admitting: Physician Assistant

## 2017-10-22 DIAGNOSIS — M4802 Spinal stenosis, cervical region: Secondary | ICD-10-CM

## 2017-10-22 DIAGNOSIS — M4722 Other spondylosis with radiculopathy, cervical region: Secondary | ICD-10-CM

## 2017-10-22 DIAGNOSIS — M4712 Other spondylosis with myelopathy, cervical region: Secondary | ICD-10-CM

## 2017-10-23 MED ORDER — TRAMADOL HCL 50 MG PO TABS: 100.0000 mg | ORAL_TABLET | Freq: Two times a day (BID) | ORAL | 1 refills | Status: DC | PRN

## 2017-10-27 ENCOUNTER — Other Ambulatory Visit: Payer: Self-pay | Admitting: Physician Assistant

## 2017-10-27 DIAGNOSIS — I10 Essential (primary) hypertension: Secondary | ICD-10-CM

## 2017-10-27 DIAGNOSIS — K2100 Gastro-esophageal reflux disease with esophagitis, without bleeding: Secondary | ICD-10-CM

## 2017-10-28 ENCOUNTER — Ambulatory Visit (INDEPENDENT_AMBULATORY_CARE_PROVIDER_SITE_OTHER): Payer: 59 | Admitting: Physician Assistant

## 2017-10-28 ENCOUNTER — Encounter: Payer: Self-pay | Admitting: Physician Assistant

## 2017-10-28 VITALS — BP 138/87 | HR 96 | Temp 98.5°F | Resp 16 | Ht 74.0 in | Wt 220.0 lb

## 2017-10-28 DIAGNOSIS — M4712 Other spondylosis with myelopathy, cervical region: Secondary | ICD-10-CM | POA: Diagnosis not present

## 2017-10-28 DIAGNOSIS — R079 Chest pain, unspecified: Secondary | ICD-10-CM | POA: Diagnosis not present

## 2017-10-28 DIAGNOSIS — F5101 Primary insomnia: Secondary | ICD-10-CM

## 2017-10-28 DIAGNOSIS — F331 Major depressive disorder, recurrent, moderate: Secondary | ICD-10-CM | POA: Diagnosis not present

## 2017-10-28 DIAGNOSIS — I10 Essential (primary) hypertension: Secondary | ICD-10-CM | POA: Diagnosis not present

## 2017-10-28 DIAGNOSIS — F411 Generalized anxiety disorder: Secondary | ICD-10-CM

## 2017-10-28 DIAGNOSIS — K2100 Gastro-esophageal reflux disease with esophagitis, without bleeding: Secondary | ICD-10-CM

## 2017-10-28 DIAGNOSIS — M4802 Spinal stenosis, cervical region: Secondary | ICD-10-CM

## 2017-10-28 DIAGNOSIS — M4722 Other spondylosis with radiculopathy, cervical region: Secondary | ICD-10-CM

## 2017-10-28 MED ORDER — MELOXICAM 15 MG PO TABS: 15.0000 mg | ORAL_TABLET | Freq: Every day | ORAL | 2 refills | Status: DC

## 2017-10-28 MED ORDER — PREDNISONE 50 MG PO TABS: ORAL_TABLET | ORAL | 0 refills | Status: DC

## 2017-10-28 MED ORDER — OMEPRAZOLE 40 MG PO CPDR: 40.0000 mg | DELAYED_RELEASE_CAPSULE | Freq: Every day | ORAL | 11 refills | Status: DC

## 2017-10-28 MED ORDER — MIRTAZAPINE 15 MG PO TABS: 15.0000 mg | ORAL_TABLET | Freq: Every day | ORAL | 2 refills | Status: DC

## 2017-10-28 MED ORDER — CLONAZEPAM 0.5 MG PO TABS: 0.5000 mg | ORAL_TABLET | Freq: Two times a day (BID) | ORAL | 1 refills | Status: DC | PRN

## 2017-10-28 MED ORDER — LISINOPRIL 5 MG PO TABS: 5.0000 mg | ORAL_TABLET | Freq: Every day | ORAL | 1 refills | Status: DC

## 2017-10-28 MED ORDER — DULOXETINE HCL 30 MG PO CPEP: 30.0000 mg | ORAL_CAPSULE | Freq: Every day | ORAL | 2 refills | Status: DC

## 2017-10-28 NOTE — Progress Notes (Addendum)
Subjective:    Patient ID: Thomas Jennings, male    DOB: 10-30-1981, 36 y.o.   MRN: [id number]  HPI  Pt is a 36 yo pleasant male with cervical spondylosis/cervical spinal stenosis who presents to the clinic with numbness and tingling worsening down right arm and into hands. He is concerned today because he has some right sided CP as well. He has neurosurgeon appt in January. Came on suddenly. No known injury but patient has a very active job with lots of lifting.   Pt is also very emotional today. He is fighting for custody of his daughter. He cries a lot. He is in constant pain. He can't sleep. He feels very anxious and like everything is going wrong. He would like something to help. Denies any SI/HC.   [name]. Active Ambulatory Problems    Diagnosis Date Noted  . Cervical spinal stenosis 03/09/2017  . Cervical spondylosis with myelopathy and radiculopathy 03/09/2017  . Elevated blood pressure reading 03/09/2017  . SOB (shortness of breath) 03/09/2017  . GAD (generalized anxiety disorder) 03/09/2017  . Primary insomnia 03/09/2017  . Essential hypertension 05/11/2017  . History of diverticulitis 08/05/2017  . Atypical chest pain 08/05/2017  . Gastroesophageal reflux disease with esophagitis 08/05/2017  . Moderate episode of recurrent major depressive disorder ([place]) 08/05/2017   Resolved Ambulatory Problems    Diagnosis Date Noted  . No Resolved Ambulatory Problems   Past Medical History:  Diagnosis Date  . Cervical spondylosis with myelopathy and radiculopathy 03/09/2017  . GAD (generalized anxiety disorder) 03/09/2017  . Primary insomnia 03/09/2017  . SOB (shortness of breath) 03/09/2017       Review of Systems  All other systems reviewed and are negative.      Objective:   Physical Exam  Constitutional: He is oriented to person, place, and time. He appears well-developed and well-nourished.  HENT:  Head: Normocephalic and atraumatic.  Cardiovascular: Normal rate, regular rhythm  and normal heart sounds.  Pulmonary/Chest: Effort normal and breath sounds normal.  Musculoskeletal:  Tenderness over cervical spine to palpation.  Strength 4/5 of right hand/arm.  NROM of right arm/shoulder.   Neurological: He is alert and oriented to person, place, and time.  Psychiatric: He has a normal mood and affect. His behavior is normal.          Assessment & Plan:  [name] KitchenMarland KitchenAustin was seen today for right sided chest pain and numbness/weakness right arm and hand.  Diagnoses and all orders for this visit:  Cervical spondylosis with myelopathy and radiculopathy -     DULoxetine (CYMBALTA) 30 MG capsule; Take 1 capsule (30 mg total) by mouth daily. -     predniSONE (DELTASONE) 50 MG tablet; Take one tablet for 5 days. -     meloxicam (MOBIC) 15 MG tablet; Take 1 tablet (15 mg total) by mouth daily. -     ketorolac (TORADOL) injection 60 mg  Moderate episode of recurrent major depressive disorder ([facility]) -     DULoxetine (CYMBALTA) 30 MG capsule; Take 1 capsule (30 mg total) by mouth daily. -     ketorolac (TORADOL) injection 60 mg  GAD (generalized anxiety disorder) -     DULoxetine (CYMBALTA) 30 MG capsule; Take 1 capsule (30 mg total) by mouth daily. -     clonazePAM (KLONOPIN) 0.5 MG tablet; Take 1 tablet (0.5 mg total) by mouth 2 (two) times daily as needed for anxiety. -     ketorolac (TORADOL) injection 60 mg  Essential hypertension -  lisinopril (PRINIVIL,ZESTRIL) 5 MG tablet; Take 1 tablet (5 mg total) by mouth daily. -     ketorolac (TORADOL) injection 60 mg  Gastroesophageal reflux disease with esophagitis -     omeprazole (PRILOSEC) 40 MG capsule; Take 1 capsule (40 mg total) by mouth daily. -     ketorolac (TORADOL) injection 60 mg  Cervical spinal stenosis -     predniSONE (DELTASONE) 50 MG tablet; Take one tablet for 5 days. -     meloxicam (MOBIC) 15 MG tablet; Take 1 tablet (15 mg total) by mouth daily. -     ketorolac (TORADOL) injection 60 mg  Primary  insomnia -     mirtazapine (REMERON) 15 MG tablet; Take 1 tablet (15 mg total) by mouth at bedtime.  EKG: NSR at 95. No ST elevation or depression. Except for in avL ST elevation.   PHQ-9 was 16. GAD-7 was 14.   I suspect right sided chest pain is non-cardiac.   Toradol given IM in office today. Mobic as needed to start tomorrow. Prednisone burst given.  KEEP appt with neurosurgeon.   For sleep start remeron. Discussed side effects. Follow up in 1 month.  Start cymbalta for mood and pain. Discussed side effects. Follow up in 1 month.  klonapin given for acute anxiety. Discussed abuse potential. Only use as needed. Follow up in 1 month.   [name].Spent 30 minutes with patient and greater than 50 percent of visit spent counseling patient regarding treatment plan.

## 2017-10-29 DIAGNOSIS — M4712 Other spondylosis with myelopathy, cervical region: Secondary | ICD-10-CM | POA: Diagnosis not present

## 2017-10-29 DIAGNOSIS — F411 Generalized anxiety disorder: Secondary | ICD-10-CM | POA: Diagnosis not present

## 2017-10-29 DIAGNOSIS — I10 Essential (primary) hypertension: Secondary | ICD-10-CM | POA: Diagnosis not present

## 2017-10-29 DIAGNOSIS — K210 Gastro-esophageal reflux disease with esophagitis: Secondary | ICD-10-CM | POA: Diagnosis not present

## 2017-10-29 DIAGNOSIS — F331 Major depressive disorder, recurrent, moderate: Secondary | ICD-10-CM | POA: Diagnosis not present

## 2017-10-29 DIAGNOSIS — M4802 Spinal stenosis, cervical region: Secondary | ICD-10-CM | POA: Diagnosis not present

## 2017-10-29 DIAGNOSIS — M4722 Other spondylosis with radiculopathy, cervical region: Secondary | ICD-10-CM | POA: Diagnosis not present

## 2017-10-29 MED ORDER — KETOROLAC TROMETHAMINE 60 MG/2ML IM SOLN
60.0000 mg | Freq: Once | INTRAMUSCULAR | Status: AC
  Administered 2017-10-29: 60 mg via INTRAMUSCULAR

## 2017-11-02 ENCOUNTER — Encounter: Payer: Self-pay | Admitting: Physician Assistant

## 2017-11-02 NOTE — Progress Notes (Deleted)
   Subjective:    Patient ID: Thomas Jennings, male    DOB: 1980/11/29, 36 y.o.   MRN: [id number]  HPI    Review of Systems     Objective:   Physical Exam        Assessment & Plan:

## 2017-11-10 ENCOUNTER — Ambulatory Visit (INDEPENDENT_AMBULATORY_CARE_PROVIDER_SITE_OTHER): Payer: 59 | Admitting: Physician Assistant

## 2017-11-10 ENCOUNTER — Encounter: Payer: Self-pay | Admitting: Physician Assistant

## 2017-11-10 VITALS — BP 146/92 | HR 95 | Ht 74.0 in | Wt 227.0 lb

## 2017-11-10 DIAGNOSIS — M5002 Cervical disc disorder with myelopathy, mid-cervical region, unspecified level: Secondary | ICD-10-CM | POA: Diagnosis not present

## 2017-11-10 DIAGNOSIS — M4802 Spinal stenosis, cervical region: Secondary | ICD-10-CM | POA: Diagnosis not present

## 2017-11-10 DIAGNOSIS — M5030 Other cervical disc degeneration, unspecified cervical region: Secondary | ICD-10-CM | POA: Diagnosis not present

## 2017-11-10 DIAGNOSIS — G5603 Carpal tunnel syndrome, bilateral upper limbs: Secondary | ICD-10-CM | POA: Diagnosis not present

## 2017-11-10 DIAGNOSIS — M4712 Other spondylosis with myelopathy, cervical region: Secondary | ICD-10-CM | POA: Diagnosis not present

## 2017-11-10 DIAGNOSIS — I10 Essential (primary) hypertension: Secondary | ICD-10-CM

## 2017-11-10 DIAGNOSIS — M4722 Other spondylosis with radiculopathy, cervical region: Secondary | ICD-10-CM

## 2017-11-10 DIAGNOSIS — F5101 Primary insomnia: Secondary | ICD-10-CM

## 2017-11-10 DIAGNOSIS — F411 Generalized anxiety disorder: Secondary | ICD-10-CM | POA: Diagnosis not present

## 2017-11-10 NOTE — Patient Instructions (Addendum)
Increase remeron to 2 tablets.  Increase lisinopril to 2 tablets.  Continue mobic/cymbalta

## 2017-11-10 NOTE — Progress Notes (Signed)
   Subjective:    Patient ID: Thomas Jennings, male    DOB: 09/22/1981, 37 y.o.   MRN: [id number]  HPI  Pt is a 37 yo male who presents to the clinic to follow up on medications.   He did see surgeon for cervical spondylosis today they discussed surgery vs injections vs living with the pain. He chose injections.   GAD- just started cymbalta. He does feel some better. He got to see his daughter this weekend as well.   HTN- just started lisinopril 5mg  daily. Denies any more CP, palpiations, headaches, vision changes.   Insomnia- improved some but still not where he wants it to be. Continues to struggle to get to sleep.   .. Active Ambulatory Problems    Diagnosis Date Noted  . Cervical spinal stenosis 03/09/2017  . Cervical spondylosis with myelopathy and radiculopathy 03/09/2017  . Elevated blood pressure reading 03/09/2017  . SOB (shortness of breath) 03/09/2017  . GAD (generalized anxiety disorder) 03/09/2017  . Primary insomnia 03/09/2017  . Essential hypertension 05/11/2017  . History of diverticulitis 08/05/2017  . Atypical chest pain 08/05/2017  . Gastroesophageal reflux disease with esophagitis 08/05/2017  . Moderate episode of recurrent major depressive disorder ([place]) 08/05/2017   Resolved Ambulatory Problems    Diagnosis Date Noted  . No Resolved Ambulatory Problems   Past Medical History:  Diagnosis Date  . Cervical spondylosis with myelopathy and radiculopathy 03/09/2017  . GAD (generalized anxiety disorder) 03/09/2017  . Primary insomnia 03/09/2017  . SOB (shortness of breath) 03/09/2017     Review of Systems  All other systems reviewed and are negative.      Objective:   Physical Exam  Constitutional: He is oriented to person, place, and time. He appears well-developed and well-nourished.  HENT:  Head: Normocephalic and atraumatic.  Cardiovascular: Normal rate, regular rhythm and normal heart sounds.  Pulmonary/Chest: Effort normal and breath sounds normal. He  has no wheezes.  Neurological: He is alert and oriented to person, place, and time.  Psychiatric: He has a normal mood and affect. His behavior is normal.          Assessment & Plan:  [name] KitchenMarland KitchenDiagnoses and all orders for this visit:  GAD (generalized anxiety disorder)  Cervical spinal stenosis  Cervical spondylosis with myelopathy and radiculopathy  Essential hypertension  Primary insomnia   Continue on cymbalta and give more time to work on mood and pain.   Continue on daily mobic and start injections to see if will help with neck and arm pain.   Increase lisinopril to 2 tablets 10mg  total to get BP to goal. Follow up in 2 months.

## 2017-11-16 ENCOUNTER — Encounter: Payer: Self-pay | Admitting: Physician Assistant

## 2017-11-16 NOTE — Addendum Note (Signed)
Addended by: Narda Rutherford on: 11/16/2017 01:51 PM   Modules accepted: Orders

## 2017-11-24 ENCOUNTER — Ambulatory Visit: Payer: 59

## 2017-12-04 ENCOUNTER — Telehealth: Payer: Self-pay

## 2017-12-04 DIAGNOSIS — M4712 Other spondylosis with myelopathy, cervical region: Secondary | ICD-10-CM

## 2017-12-04 DIAGNOSIS — M4802 Spinal stenosis, cervical region: Secondary | ICD-10-CM

## 2017-12-04 DIAGNOSIS — M4722 Other spondylosis with radiculopathy, cervical region: Secondary | ICD-10-CM

## 2017-12-04 MED ORDER — TRAMADOL HCL 50 MG PO TABS: 100.0000 mg | ORAL_TABLET | Freq: Two times a day (BID) | ORAL | 2 refills | Status: DC | PRN

## 2017-12-04 NOTE — Telephone Encounter (Signed)
Sent to pharmacy 

## 2017-12-04 NOTE — Telephone Encounter (Signed)
Patient advised.

## 2017-12-04 NOTE — Telephone Encounter (Signed)
Thomas Jennings called and states he takes 3- 4 tablets of the tramadol most days of the week. His last prescription has ran out early. He needs a refill and a change of directions on the script. Please advise.

## 2017-12-15 ENCOUNTER — Encounter: Payer: Self-pay | Admitting: Physician Assistant

## 2017-12-15 DIAGNOSIS — M5412 Radiculopathy, cervical region: Secondary | ICD-10-CM | POA: Diagnosis not present

## 2017-12-15 DIAGNOSIS — M4802 Spinal stenosis, cervical region: Secondary | ICD-10-CM | POA: Diagnosis not present

## 2017-12-15 DIAGNOSIS — M5030 Other cervical disc degeneration, unspecified cervical region: Secondary | ICD-10-CM | POA: Diagnosis not present

## 2017-12-15 DIAGNOSIS — M4712 Other spondylosis with myelopathy, cervical region: Secondary | ICD-10-CM

## 2017-12-15 DIAGNOSIS — I10 Essential (primary) hypertension: Secondary | ICD-10-CM

## 2017-12-15 DIAGNOSIS — M4722 Other spondylosis with radiculopathy, cervical region: Secondary | ICD-10-CM

## 2017-12-16 MED ORDER — LISINOPRIL 5 MG PO TABS: 5.0000 mg | ORAL_TABLET | Freq: Every day | ORAL | 0 refills | Status: DC

## 2017-12-18 ENCOUNTER — Telehealth: Payer: Self-pay

## 2017-12-18 MED ORDER — LISINOPRIL 10 MG PO TABS: 10.0000 mg | ORAL_TABLET | Freq: Every day | ORAL | 0 refills | Status: DC

## 2017-12-18 NOTE — Telephone Encounter (Signed)
Lavalle called and states last visit Lisinopril was increased to 10 mg daily. Per Jade's note the lisinopril was increase to 10 mg daily. It was refill for 5 mg. Please increase to 10 mg daily.

## 2018-01-05 ENCOUNTER — Ambulatory Visit: Payer: 59 | Admitting: Physician Assistant

## 2018-01-05 ENCOUNTER — Encounter: Payer: Self-pay | Admitting: Physician Assistant

## 2018-01-05 VITALS — BP 132/90 | HR 79 | Ht 74.0 in | Wt 230.0 lb

## 2018-01-05 DIAGNOSIS — M4802 Spinal stenosis, cervical region: Secondary | ICD-10-CM | POA: Diagnosis not present

## 2018-01-05 DIAGNOSIS — M4712 Other spondylosis with myelopathy, cervical region: Secondary | ICD-10-CM | POA: Diagnosis not present

## 2018-01-05 DIAGNOSIS — I10 Essential (primary) hypertension: Secondary | ICD-10-CM | POA: Diagnosis not present

## 2018-01-05 DIAGNOSIS — F411 Generalized anxiety disorder: Secondary | ICD-10-CM

## 2018-01-05 DIAGNOSIS — F331 Major depressive disorder, recurrent, moderate: Secondary | ICD-10-CM | POA: Diagnosis not present

## 2018-01-05 DIAGNOSIS — M4722 Other spondylosis with radiculopathy, cervical region: Secondary | ICD-10-CM

## 2018-01-05 DIAGNOSIS — F5101 Primary insomnia: Secondary | ICD-10-CM

## 2018-01-05 MED ORDER — MIRTAZAPINE 15 MG PO TABS: 15.0000 mg | ORAL_TABLET | Freq: Every day | ORAL | 2 refills | Status: DC

## 2018-01-05 MED ORDER — MELOXICAM 15 MG PO TABS: 15.0000 mg | ORAL_TABLET | Freq: Every day | ORAL | 2 refills | Status: DC

## 2018-01-05 MED ORDER — GABAPENTIN 100 MG PO CAPS: 100.0000 mg | ORAL_CAPSULE | Freq: Three times a day (TID) | ORAL | 2 refills | Status: DC

## 2018-01-05 MED ORDER — LISINOPRIL 10 MG PO TABS: 10.0000 mg | ORAL_TABLET | Freq: Every day | ORAL | 2 refills | Status: DC

## 2018-01-05 MED ORDER — DULOXETINE HCL 60 MG PO CPEP: 60.0000 mg | ORAL_CAPSULE | Freq: Every day | ORAL | 2 refills | Status: DC

## 2018-01-05 NOTE — Progress Notes (Signed)
Subjective:    Patient ID: Thomas Jennings, male    DOB: 1981/08/04, 37 y.o.   MRN: [id number]  HPI  Pt is a 37 yo male who presents to the clinic to follow up on MDD, GAD, insomnia and cervical DDD/stenosis/spondylosis.   He has been seen by neurosurgery and they are wanting to try 3 injections to see if any improvement before surgery.   MRI results 1. Very advanced for age chronic disc and endplate degeneration X8-P3 through C6-C7. Degenerative appearing vertebral marrow edema at the C5 and C6 levels. 2. Spinal stenosis with mild-to-moderate cord mass effect at C4-C5 and C5-C6. No definite spinal cord signal abnormality. Severe bilateral C5 and moderate to severe right greater than left foraminal stenosis. Both of these levels have progressed since 2013. 3. No significant spinal stenosis at C6-C7. Increased moderate to severe right C7 neural foraminal stenosis since 2013  He does feel like overall mood is improving. He is in court to get some custody of his daughter. His ex wife is letting him see her a little more. He does feel like he is crying less. He is sleeping a little better with remeron. He continues to have the numbness and tingling of extremities. 1st injection has not seemed to help a lot. He wants to get the next one before he reports back to neurosurgeon.  .. Active Ambulatory Problems    Diagnosis Date Noted  . Cervical spinal stenosis 03/09/2017  . Cervical spondylosis with myelopathy and radiculopathy 03/09/2017  . Elevated blood pressure reading 03/09/2017  . SOB (shortness of breath) 03/09/2017  . GAD (generalized anxiety disorder) 03/09/2017  . Primary insomnia 03/09/2017  . Essential hypertension 05/11/2017  . History of diverticulitis 08/05/2017  . Atypical chest pain 08/05/2017  . Gastroesophageal reflux disease with esophagitis 08/05/2017  . Moderate episode of recurrent major depressive disorder ([place]) 08/05/2017   Resolved Ambulatory Problems   Diagnosis Date Noted  . No Resolved Ambulatory Problems   Past Medical History:  Diagnosis Date  . Cervical spondylosis with myelopathy and radiculopathy 03/09/2017  . GAD (generalized anxiety disorder) 03/09/2017  . Primary insomnia 03/09/2017  . SOB (shortness of breath) 03/09/2017        Review of Systems  All other systems reviewed and are negative.      Objective:   Physical Exam  Constitutional: He is oriented to person, place, and time. He appears well-developed and well-nourished.  HENT:  Head: Normocephalic and atraumatic.  Cardiovascular: Normal rate, regular rhythm and normal heart sounds.  Pulmonary/Chest: Effort normal and breath sounds normal.  Neurological: He is alert and oriented to person, place, and time.  Skin: Skin is dry. No rash noted.  Psychiatric: He has a normal mood and affect. His behavior is normal.          Assessment & Plan:  [name] KitchenMarland KitchenDiagnoses and all orders for this visit:  Moderate episode of recurrent major depressive disorder (HCC) -     DULoxetine (CYMBALTA) 60 MG capsule; Take 1 capsule (60 mg total) by mouth daily.  Cervical spinal stenosis -     gabapentin (NEURONTIN) 100 MG capsule; Take 1 capsule (100 mg total) by mouth 3 (three) times daily. -     DULoxetine (CYMBALTA) 60 MG capsule; Take 1 capsule (60 mg total) by mouth daily. -     meloxicam (MOBIC) 15 MG tablet; Take 1 tablet (15 mg total) by mouth daily.  Cervical spondylosis with myelopathy and radiculopathy -     gabapentin (NEURONTIN) 100  MG capsule; Take 1 capsule (100 mg total) by mouth 3 (three) times daily. -     DULoxetine (CYMBALTA) 60 MG capsule; Take 1 capsule (60 mg total) by mouth daily. -     meloxicam (MOBIC) 15 MG tablet; Take 1 tablet (15 mg total) by mouth daily.  Primary insomnia -     mirtazapine (REMERON) 15 MG tablet; Take 1 tablet (15 mg total) by mouth at bedtime.  Essential hypertension -     lisinopril (PRINIVIL,ZESTRIL) 10 MG tablet; Take 1 tablet (10 mg  total) by mouth daily.  GAD (generalized anxiety disorder)    .[name] Depression screen [facility] 2/9 01/05/2018 08/05/2017 03/09/2017 03/09/2017  Decreased Interest 1 1 1 1   Down, Depressed, Hopeless 1 1 1 1   PHQ - 2 Score 2 2 2 2   Altered sleeping 2 1 3  -  Tired, decreased energy 2 1 3  -  Change in appetite 2 2 2  -  Feeling bad or failure about yourself  1 0 1 -  Trouble concentrating 2 2 3  -  Moving slowly or fidgety/restless 0 0 0 -  Suicidal thoughts 0 0 0 -  PHQ-9 Score 11 8 14  -  Difficult doing work/chores Somewhat difficult - - -   .[name] GAD 7 : Generalized Anxiety Score 01/05/2018 03/09/2017  Nervous, Anxious, on Edge 2 3  Control/stop worrying 2 3  Worry too much - different things 2 3  Trouble relaxing 2 3  Restless 2 3  Easily annoyed or irritable 1 2  Afraid - awful might happen 1 1  Total GAD 7 Score 12 18   Increased cymbalta to 60mg .  Added gabapentin to see if would help with radiculopathy. Continue mobic.   BP is better. Continue on lisinopril.   Follow up with neurosurgeon.   [name].Spent 30 minutes with patient and greater than 50 percent of visit spent counseling patient regarding treatment plan.

## 2018-01-11 ENCOUNTER — Encounter: Payer: Self-pay | Admitting: Physician Assistant

## 2018-02-02 ENCOUNTER — Other Ambulatory Visit: Payer: Self-pay | Admitting: Physician Assistant

## 2018-02-02 ENCOUNTER — Encounter: Payer: Self-pay | Admitting: Sports Medicine

## 2018-02-02 ENCOUNTER — Ambulatory Visit (INDEPENDENT_AMBULATORY_CARE_PROVIDER_SITE_OTHER): Payer: 59 | Admitting: Sports Medicine

## 2018-02-02 DIAGNOSIS — M4802 Spinal stenosis, cervical region: Secondary | ICD-10-CM | POA: Diagnosis not present

## 2018-02-02 DIAGNOSIS — G5603 Carpal tunnel syndrome, bilateral upper limbs: Secondary | ICD-10-CM | POA: Diagnosis not present

## 2018-02-02 DIAGNOSIS — I10 Essential (primary) hypertension: Secondary | ICD-10-CM

## 2018-02-02 MED ORDER — PREGABALIN 50 MG PO CAPS: 50.0000 mg | ORAL_CAPSULE | Freq: Two times a day (BID) | ORAL | 1 refills | Status: DC

## 2018-02-02 MED ORDER — TRAMADOL HCL 50 MG PO TABS: 50.0000 mg | ORAL_TABLET | Freq: Three times a day (TID) | ORAL | 0 refills | Status: DC | PRN

## 2018-02-02 NOTE — Progress Notes (Signed)
Subjective:    I'm seeing this patient as a consultation for:  Iran Planas PA-C  CC: arm pain and  numbness in both hands  HPI: Thomas Jennings, a 37yo male with a pmh significant for cervical spinal stenosis and carpal tunnel syndrome, presents today with numbness of the 1st four digits of the hand bilaterally, and a burning (neuropathic) pain in the T1 distribution of his inner arm. Pt endorses that the numbness is accompanied by a throbbing/aching pain that  also radiates to the medial and lateral sides of his upper limbs. Pt endorses  point tenderness just medial to the superior spine of scapula (posteriorly), and the glenohumeral joint (anteriorly)  that causes his left hand to go numb when palpated.   The onset of the throbbing/aching pain began with less intensity and frequency 15-18 years ago and persistently worsened with inadequate relief (by frequent stretching in which he leans forward or stretches his arms above his head, and rest) over time. Pt endorse 70yr history of unsuccessful  use of Meloxicam to manage pain.  6 weeks ago, Leevon received a cervical epidural steroid injection at space C6-C7 and reports no relief of pain. Pt reports medical management of neuropathic pain of arm with gabapentin for approximately 1 month.  Pt endorses accompanying drowsiness at the current gabapentin dosage that caused him to stop and take an unintentioal break during work. Pt reports that he did not take any  Gabapentin today. Pt reports that numbness frequently disrupts his day and that the neuropathic/aching/throbbing pain keeps him from sleeping.    I reviewed the past medical history, family history, social history, surgical history, and allergies today and no changes were needed.  Please see the problem list section below in epic for further details.  Past Medical History: Past Medical History:  Diagnosis Date  . Cervical spondylosis with myelopathy and radiculopathy 03/09/2017  . GAD  (generalized anxiety disorder) 03/09/2017  . Primary insomnia 03/09/2017  . SOB (shortness of breath) 03/09/2017   Past Surgical History: No past surgical history on file. Social History: Social History   Socioeconomic History  . Marital status: Married    Spouse name: Not on file  . Number of children: Not on file  . Years of education: Not on file  . Highest education level: Not on file  Occupational History  . Not on file  Social Needs  . Financial resource strain: Not on file  . Food insecurity:    Worry: Not on file    Inability: Not on file  . Transportation needs:    Medical: Not on file    Non-medical: Not on file  Tobacco Use  . Smoking status: Former Research scientist (life sciences)  . Smokeless tobacco: Never Used  Substance and Sexual Activity  . Alcohol use: Yes  . Drug use: No  . Sexual activity: Not Currently  Lifestyle  . Physical activity:    Days per week: Not on file    Minutes per session: Not on file  . Stress: Not on file  Relationships  . Social connections:    Talks on phone: Not on file    Gets together: Not on file    Attends religious service: Not on file    Active member of club or organization: Not on file    Attends meetings of clubs or organizations: Not on file    Relationship status: Not on file  Other Topics Concern  . Not on file  Social History Narrative  . Not on  file   Family History: Family History  Problem Relation Age of Onset  . Dementia Father   . Alcohol abuse Father   . Mental illness Mother    Allergies: No Known Allergies Medications: See med rec.  Review of Systems: No headache, visual changes,  vomiting, diarrhea, constipation, dizziness, abdominal pain, skin rash, fevers, chills, night sweats, weight loss, swollen lymph nodes,joint swelling, chest pain, shortness of breath, mood changes, visual or auditory hallucinations.   Objective:   General: Well Developed, well nourished, and in no acute distress.  Neuro:  Extra-ocular muscles  intact, able to move all 4 extremities, sensation grossly intact.  Deep tendon reflexes tested were normal. Psych: Alert and oriented, mood congruent with affect. ENT:  Ears and nose appear unremarkable.  Hearing grossly normal. Neck: Unremarkable overall appearance, trachea midline.  No visible thyroid enlargement. Eyes: Conjunctivae and lids appear unremarkable.  Pupils equal and round. Skin: Warm and dry, no rashes noted.  Cardiovascular: Pulses palpable, no extremity edema.  Right Wrist: Inspection normal with no visible erythema or swelling. ROM smooth and normal with good flexion and extension and ulnar/radial deviation that is symmetrical with opposite wrist. Palpation is normal over metacarpals, navicular, lunate, and TFCC; tendons without tenderness/ swelling No snuffbox tenderness. No tenderness over Canal of [place]. Base level of numbness in first 4 digits but Tinel's and phalens test did not ellicit more pain  .  Left Wrist Inspection normal with no visible erythema or swelling. ROM smooth and normal with good flexion and extension and ulnar/radial deviation that is symmetrical with opposite wrist. Palpation is normal over metacarpals, navicular, lunate, and TFCC; tendons without tenderness/ swelling No snuffbox tenderness. No tenderness over Canal of [place]. Base level of numbness in first 4 digit and an increased level of numbness and tingling was elicited in the first two digits  during the phalens test.  Negative  tinel's    Neck: Inspection unremarkable. No palpable stepoffs. Negative Spurling's maneuver. Full neck range of motion w. pain during all motion (flextion, extension, lateral bending and rotation).  Neck motion elicits more pain on the left shoulder, arm, and hand  than the right shoulder, arm and hand Grip strength and sensation normal in bilateral hands Strength good C4 to T1 distribution No sensory change to C4 to T1 Negative Hoffman sign  bilaterally Reflexes normal (biceps, triceps, brachioradialis)   Impression and Recommendations:   This case required medical decision making of moderate complexity.  Cervical spinal stenosis Moderate cervical canal stenosis from C4-C7. At this point I think he has been well managed medically, with gabapentin which makes him too drowsy, switching to Lyrica. Refilling tramadol, continue meloxicam. Continue Cymbalta.  If his depression is not fully controlled it is going to be difficult to control his pain. He is already had therapy, cervical epidurals, these provided no relief, not even temporary. He was seen by Dr. Joya Salm about 6 years ago, ACDF was recommended but he did not proceed. I would like him referred downstairs to [place] [facility], I do think operative intervention is the next best option.   Carpal tunnel syndrome, bilateral Bilateral nighttime splinting, return in 1 month, bilateral ultrasound guided Hydro dissection if no better.   ___________________________________________ Gwen Her. Dianah Field, M.D., ABFM., CAQSM. Primary Care and [employer] Instructor of [place] [facility]

## 2018-02-02 NOTE — Assessment & Plan Note (Signed)
Bilateral nighttime splinting, return in 1 month, bilateral ultrasound guided Hydro dissection if no better.

## 2018-02-02 NOTE — Assessment & Plan Note (Addendum)
Moderate cervical canal stenosis from C4-C7. At this point I think he has been well managed medically, with gabapentin which makes him too drowsy, switching to Lyrica. Refilling tramadol, continue meloxicam. Continue Cymbalta.  If his depression is not fully controlled it is going to be difficult to control his pain. He is already had therapy, cervical epidurals, these provided no relief, not even temporary. He was seen by Dr. Joya Salm about 6 years ago, ACDF was recommended but he did not proceed. I would like him referred downstairs to [place] [facility], I do think operative intervention is the next best option.

## 2018-02-03 ENCOUNTER — Telehealth: Payer: Self-pay | Admitting: Sports Medicine

## 2018-02-03 NOTE — Telephone Encounter (Signed)
Approvedtoday 02/03/2018 Request Reference Number: [id number]. LYRICA CAP 50MG  is approved through 02/04/2019. For further questions, call [phone].

## 2018-02-10 ENCOUNTER — Ambulatory Visit: Payer: 59 | Admitting: Physician Assistant

## 2018-02-24 ENCOUNTER — Other Ambulatory Visit: Payer: Self-pay | Admitting: *Deleted

## 2018-02-24 DIAGNOSIS — M4712 Other spondylosis with myelopathy, cervical region: Secondary | ICD-10-CM

## 2018-02-24 DIAGNOSIS — F331 Major depressive disorder, recurrent, moderate: Secondary | ICD-10-CM

## 2018-02-24 DIAGNOSIS — M4722 Other spondylosis with radiculopathy, cervical region: Principal | ICD-10-CM

## 2018-02-24 DIAGNOSIS — F411 Generalized anxiety disorder: Secondary | ICD-10-CM

## 2018-02-24 MED ORDER — DULOXETINE HCL 30 MG PO CPEP
30.0000 mg | ORAL_CAPSULE | Freq: Every day | ORAL | 2 refills | Status: DC
Start: 2018-02-24 — End: 2018-04-21

## 2018-02-24 MED ORDER — DULOXETINE HCL 30 MG PO CPEP: 30.0000 mg | ORAL_CAPSULE | Freq: Every day | ORAL | 2 refills | Status: DC

## 2018-03-03 ENCOUNTER — Ambulatory Visit: Payer: 59 | Admitting: Sports Medicine

## 2018-03-30 ENCOUNTER — Other Ambulatory Visit: Payer: Self-pay | Admitting: Physician Assistant

## 2018-03-30 DIAGNOSIS — F5101 Primary insomnia: Secondary | ICD-10-CM

## 2018-04-05 ENCOUNTER — Other Ambulatory Visit: Payer: Self-pay | Admitting: Physician Assistant

## 2018-04-05 DIAGNOSIS — I10 Essential (primary) hypertension: Secondary | ICD-10-CM

## 2018-04-07 ENCOUNTER — Ambulatory Visit: Payer: 59 | Admitting: Physician Assistant

## 2018-04-21 ENCOUNTER — Ambulatory Visit (INDEPENDENT_AMBULATORY_CARE_PROVIDER_SITE_OTHER): Payer: 59 | Admitting: Physician Assistant

## 2018-04-21 ENCOUNTER — Other Ambulatory Visit: Payer: Self-pay | Admitting: Physician Assistant

## 2018-04-21 ENCOUNTER — Encounter: Payer: Self-pay | Admitting: Physician Assistant

## 2018-04-21 VITALS — BP 136/84 | HR 81 | Wt 224.0 lb

## 2018-04-21 DIAGNOSIS — I10 Essential (primary) hypertension: Secondary | ICD-10-CM | POA: Diagnosis not present

## 2018-04-21 DIAGNOSIS — G5603 Carpal tunnel syndrome, bilateral upper limbs: Secondary | ICD-10-CM | POA: Diagnosis not present

## 2018-04-21 DIAGNOSIS — M4802 Spinal stenosis, cervical region: Secondary | ICD-10-CM | POA: Diagnosis not present

## 2018-04-21 DIAGNOSIS — F331 Major depressive disorder, recurrent, moderate: Secondary | ICD-10-CM

## 2018-04-21 DIAGNOSIS — F5101 Primary insomnia: Secondary | ICD-10-CM | POA: Diagnosis not present

## 2018-04-21 DIAGNOSIS — N522 Drug-induced erectile dysfunction: Secondary | ICD-10-CM | POA: Diagnosis not present

## 2018-04-21 DIAGNOSIS — F411 Generalized anxiety disorder: Secondary | ICD-10-CM | POA: Diagnosis not present

## 2018-04-21 DIAGNOSIS — M4712 Other spondylosis with myelopathy, cervical region: Secondary | ICD-10-CM

## 2018-04-21 DIAGNOSIS — M4722 Other spondylosis with radiculopathy, cervical region: Secondary | ICD-10-CM

## 2018-04-21 MED ORDER — MELOXICAM 15 MG PO TABS: 15.0000 mg | ORAL_TABLET | Freq: Every day | ORAL | 5 refills | Status: DC

## 2018-04-21 MED ORDER — TRAMADOL HCL 50 MG PO TABS: 50.0000 mg | ORAL_TABLET | Freq: Three times a day (TID) | ORAL | 0 refills | Status: DC | PRN

## 2018-04-21 MED ORDER — PREGABALIN 50 MG PO CAPS
50.0000 mg | ORAL_CAPSULE | Freq: Two times a day (BID) | ORAL | 1 refills | Status: DC
Start: 2018-04-21 — End: 2018-07-14

## 2018-04-21 MED ORDER — MIRTAZAPINE 15 MG PO TABS: ORAL_TABLET | ORAL | 5 refills | Status: DC

## 2018-04-21 MED ORDER — DULOXETINE HCL 30 MG PO CPEP: ORAL_CAPSULE | ORAL | 5 refills | Status: DC

## 2018-04-21 MED ORDER — LISINOPRIL 10 MG PO TABS: 10.0000 mg | ORAL_TABLET | Freq: Every day | ORAL | 5 refills | Status: DC

## 2018-04-21 MED ORDER — SILDENAFIL CITRATE 100 MG PO TABS: 100.0000 mg | ORAL_TABLET | ORAL | 0 refills | Status: DC | PRN

## 2018-04-21 NOTE — Progress Notes (Addendum)
Subjective:    Patient ID: Thomas Jennings, male    DOB: 1981-08-03, 37 y.o.   MRN: [id number]  HPI  Patient is a 37 year old male with hypertension, bilateral carpal tunnel syndrome, MDD, insomnia, GAD, cervical spinal stenosis who presents to the clinic for follow-up.  He is doing tremendously better than he has since he started coming to this clinic.  He feels like his anxiety and depression have decreased substantially.  He is in the process of working on agreement to get his daughter a court related.  Of time each month.  This makes him very happy.  He denies any suicidal thoughts or homicidal ideations.  He is taking the Cymbalta 30 mg daily.  He does feel like this also helps with his pain, anxiety, depression.  He has tried increasing to 60 but that made him feel too " drugged".  He rarely uses anything for acute anxiety.  He is sleeping much better on Remeron.  Hypertension-he denies any chest pains, palpitations, headaches or vision changes.  He is taking his blood pressure medicine daily.  He continues to have symptoms related to his cervical spinal stenosis and bilateral carpal tunnel syndrome.  The night splints have helped significantly but he cannot wear them during the day with the work that he does.  His work is very physical.  He is seeing Thomas Jennings here in the office and Thomas Jennings also encouraged him to move forward with surgery with [facility].  He has not had time to do that because his job is very demanding there has not been a good time to have these procedures. He could not afford lyrica. Tramadol does help when he haves it.   He does have a new concern today.  For the first time in his life he has had issues with maintaining an erection.  He is in a new relationship which makes him very happy but he has had a few times where he has not been able to sustainably be erect.  .. Active Ambulatory Problems    Diagnosis Date Noted  . Cervical spinal stenosis 03/09/2017  .  Elevated blood pressure reading 03/09/2017  . SOB (shortness of breath) 03/09/2017  . GAD (generalized anxiety disorder) 03/09/2017  . Primary insomnia 03/09/2017  . Essential hypertension 05/11/2017  . History of diverticulitis 08/05/2017  . Atypical chest pain 08/05/2017  . Gastroesophageal reflux disease with esophagitis 08/05/2017  . Moderate episode of recurrent major depressive disorder ([name]) 08/05/2017  . Carpal tunnel syndrome, bilateral 02/02/2018  . Drug-induced erectile dysfunction 04/23/2018   Resolved Ambulatory Problems    Diagnosis Date Noted  . Cervical spondylosis with myelopathy and radiculopathy 03/09/2017   Past Medical History:  Diagnosis Date  . Cervical spondylosis with myelopathy and radiculopathy 03/09/2017  . GAD (generalized anxiety disorder) 03/09/2017  . Primary insomnia 03/09/2017  . SOB (shortness of breath) 03/09/2017      Review of Systems See HPI.     Objective:   Physical Exam  Constitutional: He is oriented to person, place, and time. He appears well-developed and well-nourished.  HENT:  Head: Normocephalic and atraumatic.  Cardiovascular: Normal rate and regular rhythm.  Pulmonary/Chest: Effort normal and breath sounds normal.  Neurological: He is alert and oriented to person, place, and time.  Psychiatric: He has a normal mood and affect. His behavior is normal.          Assessment & Plan:  [name] KitchenMarland KitchenBryn was seen today for follow-up.  Diagnoses and all orders for  this visit:  Essential hypertension -     lisinopril (PRINIVIL,ZESTRIL) 10 MG tablet; Take 1 tablet (10 mg total) by mouth daily.  Carpal tunnel syndrome, bilateral -     meloxicam (MOBIC) 15 MG tablet; Take 1 tablet (15 mg total) by mouth daily.  Moderate episode of recurrent major depressive disorder (HCC) -     DULoxetine (CYMBALTA) 30 MG capsule; Take one tablet daily.  Primary insomnia -     mirtazapine (REMERON) 15 MG tablet; TAKE 1 TABLET BY MOUTH EVERYDAY AT  BEDTIME  GAD (generalized anxiety disorder) -     DULoxetine (CYMBALTA) 30 MG capsule; Take one tablet daily.  Cervical spinal stenosis -     DULoxetine (CYMBALTA) 30 MG capsule; Take one tablet daily. -     meloxicam (MOBIC) 15 MG tablet; Take 1 tablet (15 mg total) by mouth daily. -     pregabalin (LYRICA) 50 MG capsule; Take 1 capsule (50 mg total) by mouth 2 (two) times daily. -     traMADol (ULTRAM) 50 MG tablet; Take 1 tablet (50 mg total) by mouth every 8 (eight) hours as needed for moderate pain. Maximum 6 tabs per day.  Drug-induced erectile dysfunction -     sildenafil (VIAGRA) 100 MG tablet; Take 1 tablet (100 mg total) by mouth as needed for erectile dysfunction (for use prior to sexual activity).     .. Depression screen [facility] 2/9 04/23/2018 01/05/2018 08/05/2017 03/09/2017 03/09/2017  Decreased Interest 1 1 1 1 1   Down, Depressed, Hopeless 1 1 1 1 1   PHQ - 2 Score 2 2 2 2 2   Altered sleeping 0 2 1 3  -  Tired, decreased energy 1 2 1 3  -  Change in appetite 0 2 2 2  -  Feeling bad or failure about yourself  1 1 0 1 -  Trouble concentrating 1 2 2 3  -  Moving slowly or fidgety/restless 0 0 0 0 -  Suicidal thoughts 0 0 0 0 -  PHQ-9 Score 5 11 8 14  -  Difficult doing work/chores Somewhat difficult Somewhat difficult - - -   .[name] GAD 7 : Generalized Anxiety Score 04/23/2018 01/05/2018 03/09/2017  Nervous, Anxious, on Edge 1 2 3   Control/stop worrying 1 2 3   Worry too much - different things 1 2 3   Trouble relaxing 1 2 3   Restless 1 2 3   Easily annoyed or irritable 1 1 2   Afraid - awful might happen 1 1 1   Total GAD 7 Score 7 12 18   Anxiety Difficulty Somewhat difficult - -    Stay on cymbalta. Considered increase but he did not want the side effects again.   BP looks good continue on lisinopril.   I strongly encouraged intervention with neurosurgery. Make time for it. Encouraged follow up with Thomas Jennings to consider hydrodissection procedure to help with carpal tunnel symptoms.  Continue wearing night splints. Continue NSAIDs.  Given coupon card for lyrica to see if more affordable.  Tramadol refilled for as needed. [place] controlled substance database reviewed with no concerns.   For ED looked at medication list and did not see any one medication that is known for ED. But he is on cymbalta and remeron that could contribute. Discussed this "getting in his head". viagra sent with coupon card to try. Perhaps after getting his cervical spine stenosis fixed would help and could decrease off some of his medications.

## 2018-04-23 ENCOUNTER — Encounter: Payer: Self-pay | Admitting: Physician Assistant

## 2018-04-23 DIAGNOSIS — N522 Drug-induced erectile dysfunction: Secondary | ICD-10-CM | POA: Insufficient documentation

## 2018-05-11 ENCOUNTER — Telehealth: Payer: Self-pay

## 2018-05-11 DIAGNOSIS — M4802 Spinal stenosis, cervical region: Secondary | ICD-10-CM

## 2018-05-11 MED ORDER — TRAMADOL HCL 50 MG PO TABS: 50.0000 mg | ORAL_TABLET | Freq: Three times a day (TID) | ORAL | 0 refills | Status: DC | PRN

## 2018-05-11 NOTE — Telephone Encounter (Signed)
Sent refill and made 30 tablets. Take only one tablet a day.

## 2018-05-11 NOTE — Telephone Encounter (Signed)
Elhadj request a refill on tramadol. He states he takes one daily. Last prescription was for 21 tablets. He is a week to early for a refill.

## 2018-05-12 NOTE — Telephone Encounter (Signed)
Patient advised.

## 2018-05-25 ENCOUNTER — Other Ambulatory Visit: Payer: Self-pay

## 2018-05-25 DIAGNOSIS — N522 Drug-induced erectile dysfunction: Secondary | ICD-10-CM

## 2018-05-25 MED ORDER — SILDENAFIL CITRATE 100 MG PO TABS: 100.0000 mg | ORAL_TABLET | ORAL | 0 refills | Status: DC | PRN

## 2018-05-25 NOTE — Telephone Encounter (Signed)
Refilled Sildenafil

## 2018-06-09 ENCOUNTER — Encounter: Payer: Self-pay | Admitting: Emergency Medicine

## 2018-06-09 ENCOUNTER — Other Ambulatory Visit: Payer: Self-pay

## 2018-06-09 ENCOUNTER — Emergency Department (INDEPENDENT_AMBULATORY_CARE_PROVIDER_SITE_OTHER)
Admission: EM | Admit: 2018-06-09 | Discharge: 2018-06-09 | Disposition: A | Discharge status: Home / Self Care | Payer: 59 | Source: Home / Self Care

## 2018-06-09 DIAGNOSIS — Z23 Encounter for immunization: Secondary | ICD-10-CM | POA: Diagnosis not present

## 2018-06-09 MED ORDER — TETANUS-DIPHTH-ACELL PERTUSSIS 5-2.5-18.5 LF-MCG/0.5 IM SUSP
0.5000 mL | Freq: Once | INTRAMUSCULAR | Status: AC
  Administered 2018-06-09: 0.5 mL via INTRAMUSCULAR

## 2018-06-09 NOTE — ED Triage Notes (Signed)
Pt stepped on nail and wants TDAP.

## 2018-06-11 ENCOUNTER — Other Ambulatory Visit: Payer: Self-pay

## 2018-06-11 DIAGNOSIS — M4802 Spinal stenosis, cervical region: Secondary | ICD-10-CM

## 2018-06-11 MED ORDER — TRAMADOL HCL 50 MG PO TABS: 50.0000 mg | ORAL_TABLET | Freq: Three times a day (TID) | ORAL | 0 refills | Status: DC | PRN

## 2018-06-11 NOTE — Telephone Encounter (Signed)
Pt called requesting RF on his Tramadol. Pt states he has been in a lot of pain and is taking one tablet every morning.  Last RX written 05-11-18 for #30 with no RF  RX pended, please review and send if appropriate  Thanks!

## 2018-06-14 NOTE — Telephone Encounter (Signed)
Pt advised.

## 2018-07-14 ENCOUNTER — Encounter: Payer: Self-pay | Admitting: Physician Assistant

## 2018-07-14 ENCOUNTER — Ambulatory Visit (INDEPENDENT_AMBULATORY_CARE_PROVIDER_SITE_OTHER): Payer: 59 | Admitting: Physician Assistant

## 2018-07-14 VITALS — BP 153/86 | HR 67 | Ht 74.0 in | Wt 223.0 lb

## 2018-07-14 DIAGNOSIS — I10 Essential (primary) hypertension: Secondary | ICD-10-CM | POA: Diagnosis not present

## 2018-07-14 DIAGNOSIS — F411 Generalized anxiety disorder: Secondary | ICD-10-CM | POA: Diagnosis not present

## 2018-07-14 DIAGNOSIS — F331 Major depressive disorder, recurrent, moderate: Secondary | ICD-10-CM | POA: Diagnosis not present

## 2018-07-14 DIAGNOSIS — M4802 Spinal stenosis, cervical region: Secondary | ICD-10-CM

## 2018-07-14 DIAGNOSIS — G894 Chronic pain syndrome: Secondary | ICD-10-CM

## 2018-07-14 MED ORDER — DULOXETINE HCL 30 MG PO CPEP: ORAL_CAPSULE | ORAL | 5 refills | Status: DC

## 2018-07-14 MED ORDER — TRAMADOL HCL 50 MG PO TABS: 50.0000 mg | ORAL_TABLET | Freq: Two times a day (BID) | ORAL | 0 refills | Status: DC | PRN

## 2018-07-14 MED ORDER — LISINOPRIL 20 MG PO TABS: 20.0000 mg | ORAL_TABLET | Freq: Every day | ORAL | 5 refills | Status: DC

## 2018-07-14 MED ORDER — TRAMADOL HCL 50 MG PO TABS: 50.0000 mg | ORAL_TABLET | Freq: Three times a day (TID) | ORAL | 0 refills | Status: DC | PRN

## 2018-07-14 MED ORDER — PREGABALIN 50 MG PO CAPS: 50.0000 mg | ORAL_CAPSULE | Freq: Two times a day (BID) | ORAL | 5 refills | Status: DC

## 2018-07-14 NOTE — Patient Instructions (Addendum)
Unisom to sleep.melatonin up to 10mg  at bedtime.

## 2018-07-14 NOTE — Progress Notes (Signed)
Subjective:    Patient ID: Thomas Jennings, male    DOB: 1980-11-19, 37 y.o.   MRN: [id number]  HPI  Patient is a 37 year old male with cervical spinal stenosis, carpal tunnel syndrome bilaterally, GERD, hypertension, MDD, GAD who presents to the clinic for 66-month follow-up.  Patient has not seen Dr. Owens Shark who is managing his cervical spinal stenosis since the spring.  This is mainly because he does not have time to be out for the needed surgery.  He does admit he is having progressive weakness in his upper extremities.  The Lyrica seems to be helping some of the numbness and tingling.  He was not aware he was supposed to be taking it twice a day.  He is only taking Lyrica once a day.  He continues to have significant pain in the upper back and neck.  At times this keeps him up at night.  The tramadol does help significantly however he has not had this in a few days.  There was a discrepancy with how it was written last time. He is wearing wrist splints which are helping as well too.   BP remains elevated. No CP. He is in chronic pain.   Sleeping better but still not sleeping great. He does not like to take remeron every night. Trazodone just didn't work well.     .. Active Ambulatory Problems    Diagnosis Date Noted  . Cervical spinal stenosis 03/09/2017  . Elevated blood pressure reading 03/09/2017  . SOB (shortness of breath) 03/09/2017  . GAD (generalized anxiety disorder) 03/09/2017  . Primary insomnia 03/09/2017  . Essential hypertension 05/11/2017  . History of diverticulitis 08/05/2017  . Atypical chest pain 08/05/2017  . Gastroesophageal reflux disease with esophagitis 08/05/2017  . Moderate episode of recurrent major depressive disorder ([place]) 08/05/2017  . Carpal tunnel syndrome, bilateral 02/02/2018  . Drug-induced erectile dysfunction 04/23/2018  . Chronic pain syndrome 07/16/2018   Resolved Ambulatory Problems    Diagnosis Date Noted  . Cervical spondylosis with  myelopathy and radiculopathy 03/09/2017   No Additional Past Medical History        Review of Systems  All other systems reviewed and are negative.      Objective:   Physical Exam  Constitutional: He is oriented to person, place, and time. He appears well-developed and well-nourished.  HENT:  Head: Normocephalic and atraumatic.  Cardiovascular: Normal rate and regular rhythm.  Pulmonary/Chest: Effort normal and breath sounds normal.  Neurological: He is alert and oriented to person, place, and time.  Psychiatric: He has a normal mood and affect. His behavior is normal.          Assessment & Plan:  [name] KitchenMarland KitchenDiagnoses and all orders for this visit:  Essential hypertension -     lisinopril (PRINIVIL,ZESTRIL) 20 MG tablet; Take 1 tablet (20 mg total) by mouth daily.  Moderate episode of recurrent major depressive disorder (HCC) -     DULoxetine (CYMBALTA) 30 MG capsule; Take one tablet daily.  GAD (generalized anxiety disorder) -     DULoxetine (CYMBALTA) 30 MG capsule; Take one tablet daily.  Cervical spinal stenosis -     DULoxetine (CYMBALTA) 30 MG capsule; Take one tablet daily. -     pregabalin (LYRICA) 50 MG capsule; Take 1 capsule (50 mg total) by mouth 2 (two) times daily. -     Discontinue: traMADol (ULTRAM) 50 MG tablet; Take 1 tablet (50 mg total) by mouth every 12 (twelve) hours as needed for moderate pain  or severe pain. Maximum 6 tabs per day. -     traMADol (ULTRAM) 50 MG tablet; Take 1 tablet (50 mg total) by mouth every 8 (eight) hours as needed.  Chronic pain syndrome -     DULoxetine (CYMBALTA) 30 MG capsule; Take one tablet daily. -     pregabalin (LYRICA) 50 MG capsule; Take 1 capsule (50 mg total) by mouth 2 (two) times daily. -     traMADol (ULTRAM) 50 MG tablet; Take 1 tablet (50 mg total) by mouth every 8 (eight) hours as needed.  Other orders -     Discontinue: traMADol (ULTRAM) 50 MG tablet; Take 1 tablet (50 mg total) by mouth every 12 (twelve)  hours as needed.   .. Depression screen [facility] 2/9 04/23/2018 01/05/2018 08/05/2017 03/09/2017 03/09/2017  Decreased Interest 1 1 1 1 1   Down, Depressed, Hopeless 1 1 1 1 1   PHQ - 2 Score 2 2 2 2 2   Altered sleeping 0 2 1 3  -  Tired, decreased energy 1 2 1 3  -  Change in appetite 0 2 2 2  -  Feeling bad or failure about yourself  1 1 0 1 -  Trouble concentrating 1 2 2 3  -  Moving slowly or fidgety/restless 0 0 0 0 -  Suicidal thoughts 0 0 0 0 -  PHQ-9 Score 5 11 8 14  -  Difficult doing work/chores Somewhat difficult Somewhat difficult - - -   BP elevated. Increased lisinopril to 20mg . Follow up in 3 months.   Pt was not taking lyrica as he should. Discussed increase to twice a day and then we could even increase dosage. Refilled today. Continue cymbalta. Continue mobic. Tramadol refilled for bid.  Contract signed.  [place] controlled substance list reviewed with no concerns.   Pt states remeron is at times "too much". Try unisom OTC.

## 2018-07-16 ENCOUNTER — Encounter: Payer: Self-pay | Admitting: Physician Assistant

## 2018-07-16 DIAGNOSIS — G894 Chronic pain syndrome: Secondary | ICD-10-CM | POA: Insufficient documentation

## 2018-09-15 ENCOUNTER — Other Ambulatory Visit: Payer: Self-pay | Admitting: Physician Assistant

## 2018-09-15 DIAGNOSIS — K2100 Gastro-esophageal reflux disease with esophagitis, without bleeding: Secondary | ICD-10-CM

## 2018-10-07 ENCOUNTER — Other Ambulatory Visit: Payer: Self-pay

## 2018-10-07 DIAGNOSIS — M4802 Spinal stenosis, cervical region: Secondary | ICD-10-CM

## 2018-10-07 DIAGNOSIS — G894 Chronic pain syndrome: Secondary | ICD-10-CM

## 2018-10-07 NOTE — Telephone Encounter (Signed)
Pt called requesting RF on Tramadol   Last RX sent 09/13/2018 for #60 no RF  Pt has upcoming appt next week but states he needs additional Tramadol to hold him over until then.   RX pended, please send if appropriate

## 2018-10-08 MED ORDER — TRAMADOL HCL 50 MG PO TABS: 50.0000 mg | ORAL_TABLET | Freq: Three times a day (TID) | ORAL | 0 refills | Status: DC | PRN

## 2018-10-08 NOTE — Telephone Encounter (Signed)
Pt advised.

## 2018-10-13 ENCOUNTER — Ambulatory Visit: Payer: 59 | Admitting: Physician Assistant

## 2018-10-20 ENCOUNTER — Ambulatory Visit (INDEPENDENT_AMBULATORY_CARE_PROVIDER_SITE_OTHER): Payer: 59 | Admitting: Physician Assistant

## 2018-10-20 ENCOUNTER — Encounter: Payer: Self-pay | Admitting: Physician Assistant

## 2018-10-20 VITALS — BP 138/78 | HR 81 | Ht 74.0 in | Wt 226.0 lb

## 2018-10-20 DIAGNOSIS — M4802 Spinal stenosis, cervical region: Secondary | ICD-10-CM | POA: Diagnosis not present

## 2018-10-20 DIAGNOSIS — I10 Essential (primary) hypertension: Secondary | ICD-10-CM | POA: Diagnosis not present

## 2018-10-20 DIAGNOSIS — F331 Major depressive disorder, recurrent, moderate: Secondary | ICD-10-CM | POA: Diagnosis not present

## 2018-10-20 DIAGNOSIS — G5603 Carpal tunnel syndrome, bilateral upper limbs: Secondary | ICD-10-CM | POA: Diagnosis not present

## 2018-10-20 DIAGNOSIS — K921 Melena: Secondary | ICD-10-CM

## 2018-10-20 DIAGNOSIS — D171 Benign lipomatous neoplasm of skin and subcutaneous tissue of trunk: Secondary | ICD-10-CM

## 2018-10-20 DIAGNOSIS — F411 Generalized anxiety disorder: Secondary | ICD-10-CM

## 2018-10-20 DIAGNOSIS — F5101 Primary insomnia: Secondary | ICD-10-CM

## 2018-10-20 DIAGNOSIS — K2100 Gastro-esophageal reflux disease with esophagitis, without bleeding: Secondary | ICD-10-CM

## 2018-10-20 DIAGNOSIS — G894 Chronic pain syndrome: Secondary | ICD-10-CM

## 2018-10-20 MED ORDER — OMEPRAZOLE 40 MG PO CPDR: DELAYED_RELEASE_CAPSULE | ORAL | 1 refills | Status: DC

## 2018-10-20 MED ORDER — LISINOPRIL 20 MG PO TABS: 20.0000 mg | ORAL_TABLET | Freq: Every day | ORAL | 5 refills | Status: DC

## 2018-10-20 MED ORDER — TEMAZEPAM 7.5 MG PO CAPS: 7.5000 mg | ORAL_CAPSULE | Freq: Every evening | ORAL | 5 refills | Status: DC | PRN

## 2018-10-20 MED ORDER — MELOXICAM 15 MG PO TABS: 15.0000 mg | ORAL_TABLET | Freq: Every day | ORAL | 5 refills | Status: DC

## 2018-10-20 MED ORDER — PREGABALIN 50 MG PO CAPS: 50.0000 mg | ORAL_CAPSULE | Freq: Two times a day (BID) | ORAL | 5 refills | Status: DC

## 2018-10-20 MED ORDER — DULOXETINE HCL 30 MG PO CPEP: ORAL_CAPSULE | ORAL | 5 refills | Status: DC

## 2018-10-20 NOTE — Progress Notes (Signed)
Subjective:    Patient ID: Thomas Jennings, male    DOB: 11/24/1980, 37 y.o.   MRN: [id number]  HPI Patient is a 37 year old male with a history of cervical spinal stenosis, bilateral carpal tunnel syndrome, GERD, HTN, MDD, and GAD who presents to clinic for a 3 month follow up.  He describes his pain as overall slightly worse. He states that his pain is significantly worse in his left upper extremity. He still has numbness and tingling in addition to sharp pain, but states that the Lyrica has been helping some. He says that the Tramadol is also helping some, but does not completley relieve it. He is taking mobic as prescribed.  He has not scheduled surgery yet. Reports that he wants to but is concerned about how he is going to make ends meet. He is unsure if he has short-term disability in his benefits. He says that his left arm is difficult to use and used to be very active. He is concerned about being able to play with his daughter if his symptoms continue to progress.   He is also complaining of a "knot" on his left shoulder. States that is painful when directly pushed and causes numbness and tingling.  He is very stress as he is going through a difficult divorce and custody battle for his 84 year old daughter. He says that he is lonely and is having trouble adjusting to being single. Does not have any social outlets nor has he been to counseling at this time.  He is still struggling with sleep. He is able to fall asleep but not able to stay asleep on Remeron. He is reporting 3-4 hours of sleep a night. He does not like how Remeron makes him feel-states that he is groggy in the morning. He has taken 2 klonopin in the past and he said that it made him relaxed enough to sleep.  He also reports periodic painless bright red blood in the stool accompanied by tenesmus. He has a hx of diverticulitis. Most recent episode of BRBPR was last week. Says that he passes "a lot of blood" and it spontaneously  resolves within a day or two. He is having loose stools. Denies melena, N/V, constipation, or abdominal pain.  He denies flu shot today.  .. Active Ambulatory Problems    Diagnosis Date Noted  . Cervical spinal stenosis 03/09/2017  . Elevated blood pressure reading 03/09/2017  . SOB (shortness of breath) 03/09/2017  . GAD (generalized anxiety disorder) 03/09/2017  . Primary insomnia 03/09/2017  . Essential hypertension 05/11/2017  . History of diverticulitis 08/05/2017  . Atypical chest pain 08/05/2017  . Gastroesophageal reflux disease with esophagitis 08/05/2017  . Moderate episode of recurrent major depressive disorder ([place]) 08/05/2017  . Carpal tunnel syndrome, bilateral 02/02/2018  . Drug-induced erectile dysfunction 04/23/2018  . Chronic pain syndrome 07/16/2018  . Lipoma of torso 10/21/2018  . Blood in stool 10/21/2018   Resolved Ambulatory Problems    Diagnosis Date Noted  . Cervical spondylosis with myelopathy and radiculopathy 03/09/2017   No Additional Past Medical History     Review of Systems  Constitutional: Negative for chills and fever.  Respiratory: Negative.   Cardiovascular: Negative.   Gastrointestinal: Positive for blood in stool and diarrhea. Negative for abdominal pain, constipation, nausea and vomiting.  Musculoskeletal: Positive for myalgias.  Neurological: Positive for weakness and numbness.  Psychiatric/Behavioral: Positive for sleep disturbance. The patient is nervous/anxious.        Objective:  Physical Exam Constitutional:      General: He is not in acute distress.    Appearance: Normal appearance.  Cardiovascular:     Rate and Rhythm: Normal rate and regular rhythm.     Heart sounds: No murmur. No friction rub. No gallop.   Pulmonary:     Effort: Pulmonary effort is normal.     Breath sounds: Normal breath sounds.  Musculoskeletal:     Comments: Has mass on left shoulder. No erythema. Mass is soft and mildly tender. Causes numbness  and tingling down extremity when direct pressure applied.  Neurological:     General: No focal deficit present.     Mental Status: He is alert and oriented to person, place, and time.  Psychiatric:        Mood and Affect: Mood normal.        Behavior: Behavior normal.        Thought Content: Thought content normal.        Judgment: Judgment normal.           Assessment & Plan:  [name] KitchenMarland KitchenEustacio was seen today for follow-up.  Diagnoses and all orders for this visit:  Moderate episode of recurrent major depressive disorder (HCC) -     DULoxetine (CYMBALTA) 30 MG capsule; Take one tablet daily.  Essential hypertension -     lisinopril (PRINIVIL,ZESTRIL) 20 MG tablet; Take 1 tablet (20 mg total) by mouth daily.  Cervical spinal stenosis -     meloxicam (MOBIC) 15 MG tablet; Take 1 tablet (15 mg total) by mouth daily. -     pregabalin (LYRICA) 50 MG capsule; Take 1 capsule (50 mg total) by mouth 2 (two) times daily. -     DULoxetine (CYMBALTA) 30 MG capsule; Take one tablet daily.  Carpal tunnel syndrome, bilateral -     meloxicam (MOBIC) 15 MG tablet; Take 1 tablet (15 mg total) by mouth daily.  Gastroesophageal reflux disease with esophagitis -     omeprazole (PRILOSEC) 40 MG capsule; TAKE 1 CAPSULE BY MOUTH EVERY DAY  Chronic pain syndrome -     pregabalin (LYRICA) 50 MG capsule; Take 1 capsule (50 mg total) by mouth 2 (two) times daily. -     DULoxetine (CYMBALTA) 30 MG capsule; Take one tablet daily.  GAD (generalized anxiety disorder) -     DULoxetine (CYMBALTA) 30 MG capsule; Take one tablet daily.  Blood in stool  Lipoma of torso  Primary insomnia -     temazepam (RESTORIL) 7.5 MG capsule; Take 1 capsule (7.5 mg total) by mouth at bedtime as needed for sleep.   Discussed w/ patient the necessity to make a plan for his surgery. He is planning to call surgery to discuss how long he will be out of work so that he can make arrangements with his work. He is also following  up on if he has short-term disability available at work. Advised utilizing resources such as food banks to assist in making ends meet during that time. Continue mobic, tramadol, lyrica and cymbalta as prescribed. Discussed increase in cymbalta but patient did not like the way he felt on increase dose.   Advised patient to seek counseling. Gave information about [facility].  Will stop Remeron and start Restoril to aid with sleep.   Mass on shoulder most likely a lipoma. Advised patient to make an appointment with Dr. Darene Lamer for confirmation and removal.   Advised to start fiber for loose stools. Recommend GI referral and colonoscopy if  bleeding episodes continue. He declines referal today.   [name] KitchenVernetta Honey PA-C, have reviewed and agree with the above documentation in it's entirety.

## 2018-10-20 NOTE — Patient Instructions (Addendum)
[facility]. Address: [address], [place], [place] [address]  Hours:  Open ? Closes 5PM                          Phone: [phone] ext. [phone]   PLEASE call if rectal bleeding continues.

## 2018-10-21 ENCOUNTER — Encounter: Payer: Self-pay | Admitting: Physician Assistant

## 2018-10-21 DIAGNOSIS — K921 Melena: Secondary | ICD-10-CM | POA: Insufficient documentation

## 2018-10-21 DIAGNOSIS — D171 Benign lipomatous neoplasm of skin and subcutaneous tissue of trunk: Secondary | ICD-10-CM | POA: Insufficient documentation

## 2018-11-07 ENCOUNTER — Other Ambulatory Visit: Payer: Self-pay | Admitting: Physician Assistant

## 2018-11-07 DIAGNOSIS — M4802 Spinal stenosis, cervical region: Secondary | ICD-10-CM

## 2018-11-07 DIAGNOSIS — G894 Chronic pain syndrome: Secondary | ICD-10-CM

## 2018-11-08 NOTE — Telephone Encounter (Signed)
Pt advised.

## 2018-11-08 NOTE — Telephone Encounter (Signed)
Requesting RF on Tramadol  Last RX sent 10/08/2018 for #60 no RF   RX pended, please send if appropriate

## 2018-12-08 ENCOUNTER — Other Ambulatory Visit: Payer: Self-pay

## 2018-12-08 DIAGNOSIS — G894 Chronic pain syndrome: Secondary | ICD-10-CM

## 2018-12-08 DIAGNOSIS — M4802 Spinal stenosis, cervical region: Secondary | ICD-10-CM

## 2018-12-08 MED ORDER — TRAMADOL HCL 50 MG PO TABS: 50.0000 mg | ORAL_TABLET | Freq: Three times a day (TID) | ORAL | 0 refills | Status: DC | PRN

## 2018-12-08 NOTE — Telephone Encounter (Signed)
..  PDMP reviewed during this encounter. Refilled.

## 2018-12-08 NOTE — Telephone Encounter (Signed)
[place] requests a refill on Tramadol.

## 2018-12-15 ENCOUNTER — Ambulatory Visit: Payer: 59 | Admitting: Physician Assistant

## 2019-01-05 ENCOUNTER — Other Ambulatory Visit: Payer: Self-pay

## 2019-01-05 DIAGNOSIS — G894 Chronic pain syndrome: Secondary | ICD-10-CM

## 2019-01-05 DIAGNOSIS — M4802 Spinal stenosis, cervical region: Secondary | ICD-10-CM

## 2019-01-05 MED ORDER — TRAMADOL HCL 50 MG PO TABS: 50.0000 mg | ORAL_TABLET | Freq: Three times a day (TID) | ORAL | 0 refills | Status: DC | PRN

## 2019-02-03 ENCOUNTER — Other Ambulatory Visit: Payer: Self-pay

## 2019-02-03 DIAGNOSIS — M4802 Spinal stenosis, cervical region: Secondary | ICD-10-CM

## 2019-02-03 DIAGNOSIS — G894 Chronic pain syndrome: Secondary | ICD-10-CM

## 2019-02-03 NOTE — Telephone Encounter (Signed)
Patient requests a refill on Tramadol.

## 2019-02-04 MED ORDER — TRAMADOL HCL 50 MG PO TABS: 50.0000 mg | ORAL_TABLET | Freq: Three times a day (TID) | ORAL | 0 refills | Status: DC | PRN

## 2019-02-04 NOTE — Telephone Encounter (Signed)
..  PDMP reviewed during this encounter. No concerns.  On pain contract.  Refilled sent.

## 2019-03-07 ENCOUNTER — Other Ambulatory Visit: Payer: Self-pay

## 2019-03-07 ENCOUNTER — Encounter: Payer: Self-pay | Admitting: Physician Assistant

## 2019-03-07 ENCOUNTER — Ambulatory Visit (INDEPENDENT_AMBULATORY_CARE_PROVIDER_SITE_OTHER): Payer: 59 | Admitting: Physician Assistant

## 2019-03-07 VITALS — Ht 74.0 in

## 2019-03-07 DIAGNOSIS — G894 Chronic pain syndrome: Secondary | ICD-10-CM | POA: Diagnosis not present

## 2019-03-07 DIAGNOSIS — M4802 Spinal stenosis, cervical region: Secondary | ICD-10-CM | POA: Diagnosis not present

## 2019-03-07 DIAGNOSIS — I10 Essential (primary) hypertension: Secondary | ICD-10-CM | POA: Diagnosis not present

## 2019-03-07 DIAGNOSIS — F5101 Primary insomnia: Secondary | ICD-10-CM

## 2019-03-07 DIAGNOSIS — F411 Generalized anxiety disorder: Secondary | ICD-10-CM | POA: Diagnosis not present

## 2019-03-07 DIAGNOSIS — F331 Major depressive disorder, recurrent, moderate: Secondary | ICD-10-CM

## 2019-03-07 MED ORDER — TEMAZEPAM 7.5 MG PO CAPS: ORAL_CAPSULE | ORAL | 5 refills | Status: DC

## 2019-03-07 MED ORDER — DULOXETINE HCL 30 MG PO CPEP: ORAL_CAPSULE | ORAL | 5 refills | Status: DC

## 2019-03-07 MED ORDER — TRAMADOL HCL 50 MG PO TABS: 50.0000 mg | ORAL_TABLET | Freq: Three times a day (TID) | ORAL | 0 refills | Status: DC | PRN

## 2019-03-07 MED ORDER — PREGABALIN 50 MG PO CAPS: 50.0000 mg | ORAL_CAPSULE | Freq: Two times a day (BID) | ORAL | 5 refills | Status: DC

## 2019-03-07 MED ORDER — LISINOPRIL 20 MG PO TABS: 20.0000 mg | ORAL_TABLET | Freq: Every day | ORAL | 5 refills | Status: DC

## 2019-03-07 NOTE — Progress Notes (Deleted)
Left shoulder and arm - trouble using due to pain. Tramadol does help. Takes two a day, wants to try to increase to 3 a day. Pain is waking him up at night. 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

## 2019-03-07 NOTE — Progress Notes (Signed)
Patient ID: Thomas Jennings, male   DOB: 14-Apr-1981, 38 y.o.   MRN: [id number] .[name] KitchenVirtual Visit via Video Note  I connected with Thomas Jennings on 03/08/19 at  8:50 AM EDT by a video enabled telemedicine application and verified that I am speaking with the correct person using two identifiers.  Location: Patient: work site Provider: clinic   I discussed the limitations of evaluation and management by telemedicine and the availability of in person appointments. The patient expressed understanding and agreed to proceed.  History of Present Illness Pt is a 38 yo male with chronic pain due to cervical spinal stenosis, MDD, GAD, insomnia who calls into the clinic for medication refill. He ran out of cymbalta and never got filled. He is not taking lyrica. His pain is much worse and anxiety worse too. Seems to be worse to the left arm and shoulder. He is starting to have more and more trouble doing his job. Tramadol dose help. He request an increase. Pain is waking him up at night.    .. Active Ambulatory Problems    Diagnosis Date Noted  . Cervical spinal stenosis 03/09/2017  . Elevated blood pressure reading 03/09/2017  . SOB (shortness of breath) 03/09/2017  . GAD (generalized anxiety disorder) 03/09/2017  . Primary insomnia 03/09/2017  . Essential hypertension 05/11/2017  . History of diverticulitis 08/05/2017  . Atypical chest pain 08/05/2017  . Gastroesophageal reflux disease with esophagitis 08/05/2017  . Moderate episode of recurrent major depressive disorder ([place]) 08/05/2017  . Carpal tunnel syndrome, bilateral 02/02/2018  . Drug-induced erectile dysfunction 04/23/2018  . Chronic pain syndrome 07/16/2018  . Lipoma of torso 10/21/2018  . Blood in stool 10/21/2018   Resolved Ambulatory Problems    Diagnosis Date Noted  . Cervical spondylosis with myelopathy and radiculopathy 03/09/2017   No Additional Past Medical History   Reviewed med, problem, allergy list.     Observations/Objective: No acute distress. Flat affect. Normal appearance.   .. Today's Vitals   03/07/19 0822  Height: 6\' 2"  (1.88 m)   Body mass index is 29.02 kg/m.  .. Depression screen [facility] 2/9 03/08/2019 10/20/2018 04/23/2018 01/05/2018 08/05/2017  Decreased Interest 1 0 1 1 1   Down, Depressed, Hopeless 1 0 1 1 1   PHQ - 2 Score 2 0 2 2 2   Altered sleeping 1 3 0 2 1  Tired, decreased energy - 2 1 2 1   Change in appetite 1 1 0 2 2  Feeling bad or failure about yourself  1 0 1 1 0  Trouble concentrating 1 1 1 2 2   Moving slowly or fidgety/restless 0 0 0 0 0  Suicidal thoughts 0 0 0 0 0  PHQ-9 Score 6 7 5 11 8   Difficult doing work/chores Somewhat difficult Somewhat difficult Somewhat difficult Somewhat difficult -   .. GAD 7 : Generalized Anxiety Score 03/08/2019 10/20/2018 04/23/2018 01/05/2018  Nervous, Anxious, on Edge 2 1 1 2   Control/stop worrying 2 1 1 2   Worry too much - different things 2 1 1 2   Trouble relaxing 2 1 1 2   Restless 2 1 1 2   Easily annoyed or irritable 2 1 1 1   Afraid - awful might happen 2 0 1 1  Total GAD 7 Score 14 6 7 12   Anxiety Difficulty Somewhat difficult Somewhat difficult Somewhat difficult -       Assessment and Plan: [name] KitchenMarland KitchenAmron was seen today for shoulder pain.  Diagnoses and all orders for this visit:  Essential hypertension -  lisinopril (ZESTRIL) 20 MG tablet; Take 1 tablet (20 mg total) by mouth daily.  Cervical spinal stenosis -     traMADol (ULTRAM) 50 MG tablet; Take 1 tablet (50 mg total) by mouth every 8 (eight) hours as needed for severe pain. -     pregabalin (LYRICA) 50 MG capsule; Take 1 capsule (50 mg total) by mouth 2 (two) times daily. -     DULoxetine (CYMBALTA) 30 MG capsule; Take one tablet daily.  Chronic pain syndrome -     traMADol (ULTRAM) 50 MG tablet; Take 1 tablet (50 mg total) by mouth every 8 (eight) hours as needed for severe pain. -     pregabalin (LYRICA) 50 MG capsule; Take 1 capsule (50 mg total)  by mouth 2 (two) times daily. -     DULoxetine (CYMBALTA) 30 MG capsule; Take one tablet daily.  GAD (generalized anxiety disorder) -     DULoxetine (CYMBALTA) 30 MG capsule; Take one tablet daily.  Primary insomnia -     temazepam (RESTORIL) 7.5 MG capsule; Take one to two tablets at bedtime for sleep.  Moderate episode of recurrent major depressive disorder (HCC) -     DULoxetine (CYMBALTA) 30 MG capsule; Take one tablet daily.   Last tramadol refill was 4/3. [name] KitchenMarland KitchenPDMP reviewed during this encounter.  On pain contract.  Increased dose to 3 times a day.  I do think his anxiety/depression/pain increased due to stopping cymbalta and lyrica. Discussed he must be compliant with medications. Restarted meds.  He ultimately needs surgery. As long as he is in this much pain it is going to be hard to control mood. Please call surgeon and make a plan.   Follow up in 3 months.   Follow Up Instructions:   I discussed the assessment and treatment plan with the patient. The patient was provided an opportunity to ask questions and all were answered. The patient agreed with the plan and demonstrated an understanding of the instructions.   The patient was advised to call back or seek an in-person evaluation if the symptoms worsen or if the condition fails to improve as anticipated.  I provided 25 minutes of non-face-to-face time during this encounter.   Thomas Planas, PA-C

## 2019-04-07 ENCOUNTER — Other Ambulatory Visit: Payer: Self-pay

## 2019-04-07 DIAGNOSIS — M4802 Spinal stenosis, cervical region: Secondary | ICD-10-CM

## 2019-04-07 DIAGNOSIS — G894 Chronic pain syndrome: Secondary | ICD-10-CM

## 2019-04-07 MED ORDER — TRAMADOL HCL 50 MG PO TABS: 50.0000 mg | ORAL_TABLET | Freq: Three times a day (TID) | ORAL | 0 refills | Status: DC | PRN

## 2019-04-07 NOTE — Telephone Encounter (Signed)
[place] is requesting a refill on Tramadol.

## 2019-04-21 ENCOUNTER — Encounter: Payer: Self-pay | Admitting: Physician Assistant

## 2019-04-21 ENCOUNTER — Ambulatory Visit (INDEPENDENT_AMBULATORY_CARE_PROVIDER_SITE_OTHER): Payer: 59 | Admitting: Physician Assistant

## 2019-04-21 DIAGNOSIS — G5603 Carpal tunnel syndrome, bilateral upper limbs: Secondary | ICD-10-CM

## 2019-04-21 DIAGNOSIS — G894 Chronic pain syndrome: Secondary | ICD-10-CM | POA: Diagnosis not present

## 2019-04-21 DIAGNOSIS — M4802 Spinal stenosis, cervical region: Secondary | ICD-10-CM

## 2019-04-21 DIAGNOSIS — K2100 Gastro-esophageal reflux disease with esophagitis, without bleeding: Secondary | ICD-10-CM

## 2019-04-21 MED ORDER — MELOXICAM 15 MG PO TABS: 15.0000 mg | ORAL_TABLET | Freq: Every day | ORAL | 5 refills | Status: DC

## 2019-04-21 MED ORDER — OMEPRAZOLE 40 MG PO CPDR: DELAYED_RELEASE_CAPSULE | ORAL | 4 refills | Status: DC

## 2019-04-21 MED ORDER — TRAMADOL HCL 50 MG PO TABS: 50.0000 mg | ORAL_TABLET | Freq: Three times a day (TID) | ORAL | 0 refills | Status: DC | PRN

## 2019-04-21 MED ORDER — PREGABALIN 100 MG PO CAPS: 100.0000 mg | ORAL_CAPSULE | Freq: Two times a day (BID) | ORAL | 5 refills | Status: DC

## 2019-04-21 NOTE — Progress Notes (Signed)
Subjective:    Patient ID: Thomas Jennings, male    DOB: Oct 02, 1981, 38 y.o.   MRN: [id number]  HPI  Pt is a 38 yo male with HTN, carpal tunnel syndrome, cervical spinal stenosis, GAD, MDD who presents to the clinic for medication follow up.   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 He has a very physically demanding job. He admits his pain and weakness are getting worse and worse. His bilateral arms and hands are numb most of the time. He has tried injection which had no benefit. He does feel like medications help but he continues to have a lot of pain. Pain keeps him up at night.   .. Active Ambulatory Problems    Diagnosis Date Noted  . Cervical spinal stenosis 03/09/2017  . Elevated blood pressure reading 03/09/2017  . SOB (shortness of breath) 03/09/2017  . GAD (generalized anxiety disorder) 03/09/2017  . Primary insomnia 03/09/2017  . Essential hypertension 05/11/2017  . History of diverticulitis 08/05/2017  . Atypical chest pain 08/05/2017  . Gastroesophageal reflux disease with esophagitis 08/05/2017  . Moderate episode of recurrent major depressive disorder ([place]) 08/05/2017  . Carpal tunnel syndrome, bilateral 02/02/2018  . Drug-induced erectile dysfunction 04/23/2018  . Chronic pain syndrome 07/16/2018  . Lipoma of torso 10/21/2018  . Blood in stool 10/21/2018   Resolved Ambulatory Problems    Diagnosis Date Noted  . Cervical spondylosis with myelopathy and radiculopathy 03/09/2017   No Additional Past Medical History     Review of Systems  All other systems reviewed and are negative.      Objective:   Physical Exam Vitals signs reviewed.  Constitutional:      Appearance: Normal appearance.  HENT:     Head: Normocephalic and atraumatic.  Cardiovascular:     Rate and Rhythm: Normal rate and regular rhythm.     Pulses: Normal pulses.  Pulmonary:     Effort: Pulmonary effort is normal.     Breath  sounds: Normal breath sounds.  Musculoskeletal:     Comments: No tenderness to palpation over c-spine.  4/5 weakness of upper extremity.   Neurological:     General: No focal deficit present.     Mental Status: He is alert and oriented to person, place, and time.  Psychiatric:        Mood and Affect: Mood normal.        Behavior: Behavior normal.           Assessment & Plan:  [name] KitchenMarland KitchenKaydyn was seen today for hypertension and depression.  Diagnoses and all orders for this visit:  Cervical spinal stenosis -     meloxicam (MOBIC) 15 MG tablet; Take 1 tablet (15 mg total) by mouth daily. -     traMADol (ULTRAM) 50 MG tablet; Take 1 tablet (50 mg total) by mouth every 8 (eight) hours as needed for severe pain. -     pregabalin (LYRICA) 100 MG capsule; Take 1 capsule (100 mg total) by mouth 2 (two) times daily.  Carpal tunnel syndrome, bilateral -     meloxicam (MOBIC) 15 MG tablet; Take 1 tablet (15 mg total) by mouth daily.  Gastroesophageal reflux disease with esophagitis -     omeprazole (PRILOSEC) 40 MG capsule; TAKE 1 CAPSULE BY MOUTH EVERY DAY  Chronic pain syndrome -     traMADol (ULTRAM) 50 MG tablet; Take 1 tablet (50 mg total) by mouth every 8 (eight)  hours as needed for severe pain. -     pregabalin (LYRICA) 100 MG capsule; Take 1 capsule (100 mg total) by mouth 2 (two) times daily.    .. Depression screen [facility] 2/9 04/21/2019 03/08/2019 10/20/2018 04/23/2018 01/05/2018  Decreased Interest 1 1 0 1 1  Down, Depressed, Hopeless 1 1 0 1 1  PHQ - 2 Score 2 2 0 2 2  Altered sleeping 3 1 3  0 2  Tired, decreased energy 3 - 2 1 2   Change in appetite 1 1 1  0 2  Feeling bad or failure about yourself  0 1 0 1 1  Trouble concentrating 3 1 1 1 2   Moving slowly or fidgety/restless 1 0 0 0 0  Suicidal thoughts 0 0 0 0 0  PHQ-9 Score 13 6 7 5 11   Difficult doing work/chores Somewhat difficult Somewhat difficult Somewhat difficult Somewhat difficult Somewhat difficult   .[name] GAD 7 :  Generalized Anxiety Score 04/21/2019 03/08/2019 10/20/2018 04/23/2018  Nervous, Anxious, on Edge 1 2 1 1   Control/stop worrying 1 2 1 1   Worry too much - different things 1 2 1 1   Trouble relaxing 1 2 1 1   Restless 1 2 1 1   Easily annoyed or irritable 1 2 1 1   Afraid - awful might happen 0 2 0 1  Total GAD 7 Score 6 14 6 7   Anxiety Difficulty Somewhat difficult Somewhat difficult Somewhat difficult Somewhat difficult    Refilled medications.  Increased lyrica.  Pt needs appt with neurosurgeon(Dr. Owens Shark) and discuss recovery time and get a plan together. Pt agrees to make that appt.   ..PDMP reviewed during this encounter.   Follow up in 3 months.

## 2019-04-22 ENCOUNTER — Ambulatory Visit: Payer: 59 | Admitting: Physician Assistant

## 2019-04-26 ENCOUNTER — Encounter: Payer: Self-pay | Admitting: Physician Assistant

## 2019-06-02 ENCOUNTER — Other Ambulatory Visit: Payer: Self-pay

## 2019-06-02 DIAGNOSIS — M4802 Spinal stenosis, cervical region: Secondary | ICD-10-CM

## 2019-06-02 DIAGNOSIS — G894 Chronic pain syndrome: Secondary | ICD-10-CM

## 2019-06-02 MED ORDER — TRAMADOL HCL 50 MG PO TABS: 50.0000 mg | ORAL_TABLET | Freq: Three times a day (TID) | ORAL | 0 refills | Status: DC | PRN

## 2019-06-02 NOTE — Telephone Encounter (Signed)
Requesting RF on Tramadol  Last RX sent 03/21/19 for #90, 1 tab Q8H PRN    RX pended, please send if OK

## 2019-06-20 ENCOUNTER — Other Ambulatory Visit: Payer: Self-pay

## 2019-06-20 DIAGNOSIS — Z20822 Contact with and (suspected) exposure to covid-19: Secondary | ICD-10-CM

## 2019-06-22 LAB — NOVEL CORONAVIRUS, NAA: SARS-CoV-2, NAA: NOT DETECTED

## 2019-06-29 ENCOUNTER — Ambulatory Visit (INDEPENDENT_AMBULATORY_CARE_PROVIDER_SITE_OTHER): Payer: 59 | Admitting: Physician Assistant

## 2019-06-29 ENCOUNTER — Other Ambulatory Visit: Payer: Self-pay

## 2019-06-29 ENCOUNTER — Encounter: Payer: Self-pay | Admitting: Physician Assistant

## 2019-06-29 VITALS — BP 146/96 | HR 78 | Temp 98.7°F | Ht 73.0 in | Wt 219.0 lb

## 2019-06-29 DIAGNOSIS — Z8719 Personal history of other diseases of the digestive system: Secondary | ICD-10-CM

## 2019-06-29 DIAGNOSIS — G894 Chronic pain syndrome: Secondary | ICD-10-CM | POA: Diagnosis not present

## 2019-06-29 DIAGNOSIS — K8689 Other specified diseases of pancreas: Secondary | ICD-10-CM | POA: Diagnosis not present

## 2019-06-29 DIAGNOSIS — M4802 Spinal stenosis, cervical region: Secondary | ICD-10-CM

## 2019-06-29 DIAGNOSIS — R1030 Lower abdominal pain, unspecified: Secondary | ICD-10-CM | POA: Diagnosis not present

## 2019-06-29 LAB — POCT URINALYSIS DIPSTICK
Bilirubin, UA: NEGATIVE
Blood, UA: NEGATIVE
Glucose, UA: NEGATIVE
Ketones, UA: NEGATIVE
Leukocytes, UA: NEGATIVE
Nitrite, UA: NEGATIVE
Protein, UA: NEGATIVE
Spec Grav, UA: 1.025 (ref 1.010–1.025)
Urobilinogen, UA: 0.2 E.U./dL
pH, UA: 6 (ref 5.0–8.0)

## 2019-06-29 MED ORDER — CIPROFLOXACIN HCL 500 MG PO TABS: 500.0000 mg | ORAL_TABLET | Freq: Two times a day (BID) | ORAL | 0 refills | Status: DC

## 2019-06-29 MED ORDER — METRONIDAZOLE 500 MG PO TABS: 500.0000 mg | ORAL_TABLET | Freq: Three times a day (TID) | ORAL | 0 refills | Status: DC

## 2019-06-29 MED ORDER — TRAMADOL HCL 50 MG PO TABS: 50.0000 mg | ORAL_TABLET | Freq: Three times a day (TID) | ORAL | 0 refills | Status: DC | PRN

## 2019-06-29 NOTE — Progress Notes (Signed)
l °

## 2019-06-29 NOTE — Patient Instructions (Signed)
Will get MRI of pancreas.  Will treat empirically for diverticulitis.    Diverticulitis  Diverticulitis is when small pockets in your large intestine (colon) get infected or swollen. This causes stomach pain and watery poop (diarrhea). These pouches are called diverticula. They form in people who have a condition called diverticulosis. Follow these instructions at home: Medicines  Take over-the-counter and prescription medicines only as told by your doctor. These include: ? Antibiotics. ? Pain medicines. ? Fiber pills. ? Probiotics. ? Stool softeners.  Do not drive or use heavy machinery while taking prescription pain medicine.  If you were prescribed an antibiotic, take it as told. Do not stop taking it even if you feel better. General instructions   Follow a diet as told by your doctor.  When you feel better, your doctor may tell you to change your diet. You may need to eat a lot of fiber. Fiber makes it easier to poop (have bowel movements). Healthy foods with fiber include: ? Berries. ? Beans. ? Lentils. ? Green vegetables.  Exercise 3 or more times a week. Aim for 30 minutes each time. Exercise enough to sweat and make your heart beat faster.  Keep all follow-up visits as told. This is important. You may need to have an exam of the large intestine. This is called a colonoscopy. Contact a doctor if:  Your pain does not get better.  You have a hard time eating or drinking.  You are not pooping like normal. Get help right away if:  Your pain gets worse.  Your problems do not get better.  Your problems get worse very fast.  You have a fever.  You throw up (vomit) more than one time.  You have poop that is: ? Bloody. ? Black. ? Tarry. Summary  Diverticulitis is when small pockets in your large intestine (colon) get infected or swollen.  Take medicines only as told by your doctor.  Follow a diet as told by your doctor. This information is not intended  to replace advice given to you by your health care provider. Make sure you discuss any questions you have with your health care provider. Document Released: 04/07/2008 Document Revised: 10/02/2017 Document Reviewed: 11/06/2016 Elsevier Patient Education  2020 [employer].

## 2019-06-29 NOTE — Progress Notes (Signed)
Subjective:    Patient ID: Thomas Jennings, male    DOB: 04-Dec-1980, 38 y.o.   MRN: [id number] 2018 mass of pancrease  CT. And diver HPI  Pt is a 38 yo male with chronic neck pain due to severe cervical spinal stenosis who presents to the clinic with 1 week of lower abdominal pain. He didn't know what to do so he just started taking tramadol more often so that he could work. He now needs an early refill. He has had diverticulitis before in 2018 and pain feels similar. He denies any urinary pain but bowel movements are very painful. He denies any melena or hematochezia. BM are painful but stools are soft. He tested negative for COVID.   2018 CT scan showed.   IMPRESSION: Acute diverticulitis of sigmoid colon.  Question isodense mass at the junction of head and body of pancreas. Evaluation is limited on this noncontrast exam. Recommend further evaluation with MRI of the pancreas on outpatient basis.  Pt sees Dr. Owens Shark in [place] for neck pain. He would like a second opinion. He wants to make sure surgery is only option. Failed epidural injections.   .. Active Ambulatory Problems    Diagnosis Date Noted  . Cervical spinal stenosis 03/09/2017  . Elevated blood pressure reading 03/09/2017  . SOB (shortness of breath) 03/09/2017  . GAD (generalized anxiety disorder) 03/09/2017  . Primary insomnia 03/09/2017  . Essential hypertension 05/11/2017  . History of diverticulitis 08/05/2017  . Atypical chest pain 08/05/2017  . Gastroesophageal reflux disease with esophagitis 08/05/2017  . Moderate episode of recurrent major depressive disorder ([place]) 08/05/2017  . Carpal tunnel syndrome, bilateral 02/02/2018  . Drug-induced erectile dysfunction 04/23/2018  . Chronic pain syndrome 07/16/2018  . Lipoma of torso 10/21/2018  . Blood in stool 10/21/2018  . Pancreatic mass 06/29/2019  . Lower abdominal pain 06/29/2019   Resolved Ambulatory Problems    Diagnosis Date Noted  . Cervical spondylosis  with myelopathy and radiculopathy 03/09/2017   No Additional Past Medical History    Review of Systems    see HPI>  Objective:   Physical Exam Vitals signs reviewed.  Constitutional:      Appearance: He is well-developed.  Cardiovascular:     Rate and Rhythm: Normal rate and regular rhythm.  Pulmonary:     Effort: Pulmonary effort is normal.  Chest:     Chest wall: No tenderness.  Abdominal:     General: Bowel sounds are normal.     Palpations: Abdomen is soft. There is no mass.     Tenderness: There is abdominal tenderness in the left lower quadrant. There is no right CVA tenderness or left CVA tenderness.     Hernia: No hernia is present.  Neurological:     General: No focal deficit present.     Mental Status: He is alert.  Psychiatric:        Mood and Affect: Mood normal.           Assessment & Plan:  [name] KitchenMarland KitchenGiann was seen today for abdominal pain.  Diagnoses and all orders for this visit:  Lower abdominal pain -     POCT urinalysis dipstick -     ciprofloxacin (CIPRO) 500 MG tablet; Take 1 tablet (500 mg total) by mouth 2 (two) times daily. For 10 days. -     metroNIDAZOLE (FLAGYL) 500 MG tablet; Take 1 tablet (500 mg total) by mouth 3 (three) times daily. For 10 days.  Cervical spinal stenosis -  traMADol (ULTRAM) 50 MG tablet; Take 1 tablet (50 mg total) by mouth every 8 (eight) hours as needed for severe pain. -     Ambulatory referral to Neurosurgery  Chronic pain syndrome -     traMADol (ULTRAM) 50 MG tablet; Take 1 tablet (50 mg total) by mouth every 8 (eight) hours as needed for severe pain. -     Ambulatory referral to Neurosurgery  Pancreatic mass  History of diverticulitis -     ciprofloxacin (CIPRO) 500 MG tablet; Take 1 tablet (500 mg total) by mouth 2 (two) times daily. For 10 days. -     metroNIDAZOLE (FLAGYL) 500 MG tablet; Take 1 tablet (500 mg total) by mouth 3 (three) times daily. For 10 days.  .. Results for orders placed or performed  in visit on 06/29/19  POCT urinalysis dipstick  Result Value Ref Range   Color, UA yellow    Clarity, UA clear    Glucose, UA Negative Negative   Bilirubin, UA negative    Ketones, UA negative    Spec Grav, UA 1.025 1.010 - 1.025   Blood, UA negative    pH, UA 6.0 5.0 - 8.0   Protein, UA Negative Negative   Urobilinogen, UA 0.2 0.2 or 1.0 E.U./dL   Nitrite, UA negative    Leukocytes, UA Negative Negative   Appearance     Odor     UA looks great.   With patients hx of diverticulitis will treat empirically with cipro and metronidazole. Ok early refill of tramadol and to only take how is written on bottle.   After looking at 2018 CT found report of pancreatic mass. This has never been followed up on. Will order MRI.   Will make another referral to neurosurgery for second opinion.

## 2019-07-01 ENCOUNTER — Telehealth: Payer: Self-pay | Admitting: Physician Assistant

## 2019-07-01 DIAGNOSIS — K8689 Other specified diseases of pancreas: Secondary | ICD-10-CM

## 2019-07-01 DIAGNOSIS — R1907 Generalized intra-abdominal and pelvic swelling, mass and lump: Secondary | ICD-10-CM

## 2019-07-01 NOTE — Telephone Encounter (Signed)
Thank you.  signed

## 2019-07-01 NOTE — Telephone Encounter (Signed)
Isodense mass of pancreas  Found on CT. MRI abdomen with and without contrast? Can you call imaging.

## 2019-07-01 NOTE — Telephone Encounter (Signed)
Spoke with radiologist. They confirmed to order MR abd w/wo contrast.

## 2019-07-04 ENCOUNTER — Telehealth: Payer: Self-pay

## 2019-07-04 DIAGNOSIS — I10 Essential (primary) hypertension: Secondary | ICD-10-CM

## 2019-07-04 NOTE — Telephone Encounter (Signed)
Labs needed for MRI w/ contrast

## 2019-07-13 ENCOUNTER — Ambulatory Visit: Payer: 59 | Admitting: Physician Assistant

## 2019-07-22 LAB — COMPLETE METABOLIC PANEL WITH GFR
AG Ratio: 1.9 (calc) (ref 1.0–2.5)
ALT: 18 U/L (ref 9–46)
AST: 16 U/L (ref 10–40)
Albumin: 4.6 g/dL (ref 3.6–5.1)
Alkaline phosphatase (APISO): 47 U/L (ref 36–130)
BUN: 15 mg/dL (ref 7–25)
CO2: 24 mmol/L (ref 20–32)
Calcium: 9.5 mg/dL (ref 8.6–10.3)
Chloride: 104 mmol/L (ref 98–110)
Creat: 0.79 mg/dL (ref 0.60–1.35)
GFR, Est African American: 130 mL/min/{1.73_m2} (ref >=60)
GFR, Est Non African American: 112 mL/min/{1.73_m2} (ref >=60)
Globulin: 2.4 g/dL (calc) (ref 1.9–3.7)
Glucose, Bld: 91 mg/dL (ref 65–99)
Potassium: 4.7 mmol/L (ref 3.5–5.3)
Sodium: 141 mmol/L (ref 135–146)
Total Bilirubin: 0.9 mg/dL (ref 0.2–1.2)
Total Protein: 7 g/dL (ref 6.1–8.1)

## 2019-07-22 NOTE — Telephone Encounter (Signed)
Labs look great.

## 2019-07-29 ENCOUNTER — Other Ambulatory Visit: Payer: Self-pay

## 2019-07-29 DIAGNOSIS — M4802 Spinal stenosis, cervical region: Secondary | ICD-10-CM

## 2019-07-29 DIAGNOSIS — G894 Chronic pain syndrome: Secondary | ICD-10-CM

## 2019-08-01 MED ORDER — TRAMADOL HCL 50 MG PO TABS: 50.0000 mg | ORAL_TABLET | Freq: Three times a day (TID) | ORAL | 0 refills | Status: DC | PRN

## 2019-08-04 ENCOUNTER — Other Ambulatory Visit: Payer: Self-pay | Admitting: Physician Assistant

## 2019-08-04 DIAGNOSIS — N522 Drug-induced erectile dysfunction: Secondary | ICD-10-CM

## 2019-08-09 ENCOUNTER — Ambulatory Visit (INDEPENDENT_AMBULATORY_CARE_PROVIDER_SITE_OTHER): Payer: 59 | Admitting: Family Medicine

## 2019-08-09 ENCOUNTER — Encounter: Payer: Self-pay | Admitting: Family Medicine

## 2019-08-09 ENCOUNTER — Other Ambulatory Visit: Payer: Self-pay

## 2019-08-09 VITALS — BP 161/94 | HR 77 | Wt 220.0 lb

## 2019-08-09 DIAGNOSIS — M7712 Lateral epicondylitis, left elbow: Secondary | ICD-10-CM

## 2019-08-09 MED ORDER — DICLOFENAC SODIUM 1 % TD GEL: 4.0000 g | Freq: Four times a day (QID) | TRANSDERMAL | 11 refills | Status: DC

## 2019-08-09 NOTE — Progress Notes (Signed)
Thomas Jennings is a 38 y.o. male who presents to [facility] today for discuss left elbow pain.  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  This is a job that requires lots of arm activity.  Specifically he notes holding a chainsaw is very painful.  He notes pain with any activity with wrist extension or firm grip.  Pain is located at the lateral elbow.  He denies any injury.  Pain is been ongoing for about a year but worsened a lot about 2 months ago.  The pain can be severe enough that he is having trouble at work and is at risk for losing his job.  Additionally he cannot play guitar or do push-ups at home because of the pain.  He is tried taking over-the-counter medications for pain with little benefit.  No fevers chills nausea vomiting or diarrhea.   ROS:  As above  Exam:  BP (!) 161/94   Pulse 77   Wt 220 lb (99.8 kg)   BMI 29.03 kg/m  Wt Readings from Last 5 Encounters:  08/09/19 220 lb (99.8 kg)  06/29/19 219 lb (99.3 kg)  04/21/19 225 lb (102.1 kg)  10/20/18 226 lb (102.5 kg)  07/14/18 223 lb (101.2 kg)   General: Well Developed, well nourished, and in no acute distress.  Neuro/Psych: Alert and oriented x3, extra-ocular muscles intact, able to move all 4 extremities, sensation grossly intact. Skin: Warm and dry, no rashes noted.  Respiratory: Not using accessory muscles, speaking in full sentences, trachea midline.  Cardiovascular: Pulses palpable, no extremity edema. Abdomen: Does not appear distended. MSK:  Left elbow: Normal-appearing normal motion normal strength. Tender to palpation lateral epicondyle. Pain is worse with resisted wrist extension and finger extension. Pulses cap refill and sensation are intact into the wrist.    Lab and Radiology Results Procedure: Real-time Ultrasound Guided Injection of left lateral epicondyle Device: GE Logiq E   Images permanently stored and available  for review in the ultrasound unit. Verbal informed consent obtained.  Discussed risks and benefits of procedure. Warned about infection bleeding damage to structures skin hypopigmentation and fat atrophy among others. Patient expresses understanding and agreement Time-out conducted.   Noted no overlying erythema, induration, or other signs of local infection.   Skin prepped in a sterile fashion.   Local anesthesia: Topical Ethyl chloride.   With sterile technique and under real time ultrasound guidance:  40 mg of Kenalog and 1 mL of Marcaine injected easily.   Completed without difficulty   Pain immediately resolved suggesting accurate placement of the medication.   Advised to call if fevers/chills, erythema, induration, drainage, or persistent bleeding.   Images permanently stored and available for review in the ultrasound unit.  Impression: Technically successful ultrasound guided injection.         Assessment and Plan: 38 y.o. male with left elbow pain.  Lateral epicondylitis.  Symptoms are somewhat severe and significantly interfering with his ability to work and at home.  Plan for wrist brace or counterforce brace as needed.  Also use diclofenac gel and injection as above.  Work on home exercise program and recheck if not improving.  Precautions reviewed recheck as needed.   PDMP not reviewed this encounter. No orders of the defined types were placed in this encounter.  Meds ordered this encounter  Medications  . diclofenac sodium (VOLTAREN) 1 % GEL    Sig: Apply 4 g topically 4 (four) times  daily. To affected joint.    Dispense:  100 g    Refill:  11    Historical information moved to improve visibility of documentation.  Past Medical History:  Diagnosis Date  . Cervical spondylosis with myelopathy and radiculopathy 03/09/2017  . GAD (generalized anxiety disorder) 03/09/2017  . Primary insomnia 03/09/2017  . SOB (shortness of breath) 03/09/2017   No past surgical history on  file. Social History   Tobacco Use  . Smoking status: Former Research scientist (life sciences)  . Smokeless tobacco: Never Used  Substance Use Topics  . Alcohol use: Yes   family history includes Alcohol abuse in his father; Dementia in his father; Mental illness in his mother.  Medications: Current Outpatient Medications  Medication Sig Dispense Refill  . ciprofloxacin (CIPRO) 500 MG tablet Take 1 tablet (500 mg total) by mouth 2 (two) times daily. For 10 days. 20 tablet 0  . diclofenac sodium (VOLTAREN) 1 % GEL Apply 4 g topically 4 (four) times daily. To affected joint. 100 g 11  . DULoxetine (CYMBALTA) 30 MG capsule Take one tablet daily. 30 capsule 5  . lisinopril (ZESTRIL) 20 MG tablet Take 1 tablet (20 mg total) by mouth daily. 30 tablet 5  . meloxicam (MOBIC) 15 MG tablet Take 1 tablet (15 mg total) by mouth daily. 30 tablet 5  . metroNIDAZOLE (FLAGYL) 500 MG tablet Take 1 tablet (500 mg total) by mouth 3 (three) times daily. For 10 days. 30 tablet 0  . omeprazole (PRILOSEC) 40 MG capsule TAKE 1 CAPSULE BY MOUTH EVERY DAY 90 capsule 4  . pregabalin (LYRICA) 100 MG capsule Take 1 capsule (100 mg total) by mouth 2 (two) times daily. 60 capsule 5  . sildenafil (VIAGRA) 100 MG tablet TAKE 1 TABLET EVERY DAY (PRIOR TO SEXUAL ACTIVITY) AS NEEDED FOR ERECTILE DYSFUNCTION 3 tablet 3  . temazepam (RESTORIL) 7.5 MG capsule Take one to two tablets at bedtime for sleep. 60 capsule 5  . traMADol (ULTRAM) 50 MG tablet Take 1 tablet (50 mg total) by mouth every 8 (eight) hours as needed for severe pain. 90 tablet 0   No current facility-administered medications for this visit.    Allergies  Allergen Reactions  . Gabapentin     dizzy      Discussed warning signs or symptoms. Please see discharge instructions. Patient expresses understanding.

## 2019-08-09 NOTE — Patient Instructions (Addendum)
Thank you for coming in today. Call or go to the ER if you develop a large red swollen joint with extreme pain or oozing puss.  This is tennis elbow.  Do the stretch with the elbow straight.  Do the strength exercise. Elbow straight wrist from up to down slowly with resistance.  30 reps 2x daily.  Ok to use [employer].   Ok to use wrist brace or counterdorce brace over the counter as needed.  Use diclofenac gel as needed for pain.   Recheck as needed.    Tennis Elbow Tennis elbow is swelling (inflammation) in your outer forearm, near your elbow. Swelling affects the tissues that connect muscle to bone (tendons). Tennis elbow can happen in any sport or job in which you use your elbow too much. It is caused by doing the same motion over and over. Tennis elbow can cause:  Pain and tenderness in your forearm and the outer part of your elbow. You may have pain all the time, or only when using the arm.  A burning feeling. This runs from your elbow through your arm.  Weak grip in your hand. Follow these instructions at home: Activity  Rest your elbow and wrist. Avoid activities that cause problems, as told by your doctor.  If told by your doctor, wear an elbow strap to reduce stress on the area.  Do physical therapy exercises as told.  If you lift an object, lift it with your palm facing up. This is easier on your elbow. Lifestyle  If your tennis elbow is caused by sports, check your equipment and make sure that: ? You are using it correctly. ? It fits you well.  If your tennis elbow is caused by work or by using a computer, take breaks often to stretch your arm. Talk with your manager about how you can manage your condition at work. If you have a brace:  Wear the brace as told by your doctor. Remove it only as told by your doctor.  Loosen the brace if your fingers tingle, get numb, or turn cold and blue.  Keep the brace clean.  If the brace is not waterproof, ask your  doctor if you may take the brace off for bathing. If you must keep the brace on while bathing: ? Do not let it get wet. ? Cover it with a watertight covering when you take a bath or a shower. General instructions   If told, put ice on the painful area: ? Put ice in a plastic bag. ? Place a towel between your skin and the bag. ? Leave the ice on for 20 minutes, 2-3 times a day.  Take over-the-counter and prescription medicines only as told by your doctor.  Keep all follow-up visits as told by your doctor. This is important. Contact a doctor if:  Your pain does not get better with treatment.  Your pain gets worse.  You have weakness in your forearm, hand, or fingers.  You cannot feel your forearm, hand, or fingers. Summary  Tennis elbow is swelling (inflammation) in your outer forearm, near your elbow.  Tennis elbow is caused by doing the same motion over and over.  Rest your elbow and wrist. Avoid activities that cause problems, as told by your doctor.  If told, put ice on the painful area for 20 minutes, 2-3 times a day. This information is not intended to replace advice given to you by your health care provider. Make sure you discuss any questions  you have with your health care provider. Document Released: 04/09/2010 Document Revised: 07/16/2018 Document Reviewed: 08/04/2017 Elsevier Patient Education  2020 [employer].

## 2019-08-15 ENCOUNTER — Other Ambulatory Visit: Payer: Self-pay

## 2019-08-15 ENCOUNTER — Ambulatory Visit: Payer: 59

## 2019-08-15 MED ORDER — GADOBUTROL 1 MMOL/ML IV SOLN
10.0000 mL | Freq: Once | INTRAVENOUS | Status: AC | PRN
  Administered 2019-08-15: 10 mL via INTRAVENOUS

## 2019-08-15 NOTE — Telephone Encounter (Signed)
GREAT news. No evidence of any pancreatic mass or lesion.

## 2019-08-29 ENCOUNTER — Other Ambulatory Visit: Payer: Self-pay

## 2019-08-29 DIAGNOSIS — M4802 Spinal stenosis, cervical region: Secondary | ICD-10-CM

## 2019-08-29 DIAGNOSIS — G894 Chronic pain syndrome: Secondary | ICD-10-CM

## 2019-08-30 MED ORDER — TRAMADOL HCL 50 MG PO TABS: 50.0000 mg | ORAL_TABLET | Freq: Three times a day (TID) | ORAL | 0 refills | Status: DC | PRN

## 2019-09-19 ENCOUNTER — Other Ambulatory Visit: Payer: Self-pay

## 2019-09-19 DIAGNOSIS — Z20822 Contact with and (suspected) exposure to covid-19: Secondary | ICD-10-CM

## 2019-09-21 LAB — NOVEL CORONAVIRUS, NAA: SARS-CoV-2, NAA: NOT DETECTED

## 2019-09-28 ENCOUNTER — Other Ambulatory Visit: Payer: Self-pay

## 2019-09-28 DIAGNOSIS — M4802 Spinal stenosis, cervical region: Secondary | ICD-10-CM

## 2019-09-28 DIAGNOSIS — G894 Chronic pain syndrome: Secondary | ICD-10-CM

## 2019-09-28 MED ORDER — TRAMADOL HCL 50 MG PO TABS: 50.0000 mg | ORAL_TABLET | Freq: Three times a day (TID) | ORAL | 0 refills | Status: DC | PRN

## 2019-09-28 NOTE — Telephone Encounter (Signed)
Patient transferred to front to schedule a follow up.

## 2019-10-11 ENCOUNTER — Ambulatory Visit: Payer: 59 | Admitting: Physician Assistant

## 2019-10-26 ENCOUNTER — Other Ambulatory Visit: Payer: Self-pay | Admitting: Neurology

## 2019-10-26 DIAGNOSIS — G894 Chronic pain syndrome: Secondary | ICD-10-CM

## 2019-10-26 DIAGNOSIS — M4802 Spinal stenosis, cervical region: Secondary | ICD-10-CM

## 2019-10-26 MED ORDER — TRAMADOL HCL 50 MG PO TABS: 50.0000 mg | ORAL_TABLET | Freq: Three times a day (TID) | ORAL | 0 refills | Status: DC | PRN

## 2019-10-26 NOTE — Telephone Encounter (Signed)
Patient called and left vm for refill.  Last filled 09/28/2019 #90 with no refills. On pain contract. Last appt with Luvenia Starch 06/29/2019/ Dr. Georgina Snell 08/09/2019.

## 2019-11-02 ENCOUNTER — Ambulatory Visit: Payer: 59 | Admitting: Physician Assistant

## 2019-11-14 ENCOUNTER — Ambulatory Visit (INDEPENDENT_AMBULATORY_CARE_PROVIDER_SITE_OTHER): Payer: 59 | Admitting: Physician Assistant

## 2019-11-14 ENCOUNTER — Other Ambulatory Visit: Payer: Self-pay

## 2019-11-14 VITALS — BP 145/89 | HR 98 | Ht 73.0 in | Wt 227.0 lb

## 2019-11-14 DIAGNOSIS — F331 Major depressive disorder, recurrent, moderate: Secondary | ICD-10-CM

## 2019-11-14 DIAGNOSIS — G4700 Insomnia, unspecified: Secondary | ICD-10-CM

## 2019-11-14 DIAGNOSIS — G894 Chronic pain syndrome: Secondary | ICD-10-CM | POA: Diagnosis not present

## 2019-11-14 DIAGNOSIS — Z13220 Encounter for screening for lipoid disorders: Secondary | ICD-10-CM

## 2019-11-14 DIAGNOSIS — F411 Generalized anxiety disorder: Secondary | ICD-10-CM

## 2019-11-14 DIAGNOSIS — M4802 Spinal stenosis, cervical region: Secondary | ICD-10-CM | POA: Diagnosis not present

## 2019-11-14 DIAGNOSIS — I10 Essential (primary) hypertension: Secondary | ICD-10-CM

## 2019-11-14 DIAGNOSIS — R0789 Other chest pain: Secondary | ICD-10-CM

## 2019-11-14 DIAGNOSIS — G5603 Carpal tunnel syndrome, bilateral upper limbs: Secondary | ICD-10-CM

## 2019-11-14 MED ORDER — MELOXICAM 15 MG PO TABS: 15.0000 mg | ORAL_TABLET | Freq: Every day | ORAL | 5 refills | Status: DC

## 2019-11-14 MED ORDER — DULOXETINE HCL 30 MG PO CPEP: ORAL_CAPSULE | ORAL | 5 refills | Status: DC

## 2019-11-14 MED ORDER — TRAMADOL HCL 50 MG PO TABS: 50.0000 mg | ORAL_TABLET | Freq: Three times a day (TID) | ORAL | 0 refills | Status: DC | PRN

## 2019-11-14 MED ORDER — QUETIAPINE FUMARATE 25 MG PO TABS: ORAL_TABLET | ORAL | 0 refills | Status: DC

## 2019-11-14 MED ORDER — LOSARTAN POTASSIUM 100 MG PO TABS: 100.0000 mg | ORAL_TABLET | Freq: Every day | ORAL | 1 refills | Status: DC

## 2019-11-14 MED ORDER — PREGABALIN 100 MG PO CAPS: 100.0000 mg | ORAL_CAPSULE | Freq: Two times a day (BID) | ORAL | 5 refills | Status: DC

## 2019-11-14 NOTE — Progress Notes (Signed)
Subjective:    Patient ID: Thomas Jennings, male    DOB: Nov 22, 1980, 40 y.o.   MRN: [id number]  HPI  Pt is a 39 yo male with HTN, chronic pain, cervical spinal stenosis, insomnia, MDD, GAD who presents to the clinic for follow.   He does have MRI Wednesday ordered by neurosurgeon who potentially will be doing surgery. He continues on lyrica, mobic, cymbalta and tramadol.   He is not sleeping well. He really feels like he is not sleeping at all. He has tried trazodone, remeron, and temazepam. He gets more and more anxious at night. At times he feels like his heart is pounding. He denies any CP or pressure with exertion or during the day at all.on lisionpril. Feels like he has constant dry cough since starting.   .. Active Ambulatory Problems    Diagnosis Date Noted  . Cervical spinal stenosis 03/09/2017  . Elevated blood pressure reading 03/09/2017  . SOB (shortness of breath) 03/09/2017  . GAD (generalized anxiety disorder) 03/09/2017  . Primary insomnia 03/09/2017  . Essential hypertension 05/11/2017  . History of diverticulitis 08/05/2017  . Atypical chest pain 08/05/2017  . Gastroesophageal reflux disease with esophagitis 08/05/2017  . Moderate episode of recurrent major depressive disorder ([place]) 08/05/2017  . Carpal tunnel syndrome, bilateral 02/02/2018  . Drug-induced erectile dysfunction 04/23/2018  . Chronic pain syndrome 07/16/2018  . Lipoma of torso 10/21/2018  . Blood in stool 10/21/2018  . Pancreatic mass 06/29/2019  . Lower abdominal pain 06/29/2019  . Lateral epicondylitis, left elbow 08/09/2019   Resolved Ambulatory Problems    Diagnosis Date Noted  . Cervical spondylosis with myelopathy and radiculopathy 03/09/2017   No Additional Past Medical History        Review of Systems See HPI.     Objective:   Physical Exam Vitals reviewed.  Constitutional:      Appearance: Normal appearance.  Cardiovascular:     Rate and Rhythm: Normal rate.     Pulses:  Normal pulses.  Pulmonary:     Effort: Pulmonary effort is normal.  Neurological:     General: No focal deficit present.     Mental Status: He is alert and oriented to person, place, and time.  Psychiatric:        Mood and Affect: Mood normal.    .. Depression screen [facility] 2/9 06/29/2019 04/21/2019 03/08/2019 10/20/2018 04/23/2018  Decreased Interest 1 1 1  0 1  Down, Depressed, Hopeless 1 1 1  0 1  PHQ - 2 Score 2 2 2  0 2  Altered sleeping 3 3 1 3  0  Tired, decreased energy 3 3 - 2 1  Change in appetite 0 1 1 1  0  Feeling bad or failure about yourself  0 0 1 0 1  Trouble concentrating 3 3 1 1 1   Moving slowly or fidgety/restless 0 1 0 0 0  Suicidal thoughts 0 0 0 0 0  PHQ-9 Score 11 13 6 7 5   Difficult doing work/chores Somewhat difficult Somewhat difficult Somewhat difficult Somewhat difficult Somewhat difficult   .[name] GAD 7 : Generalized Anxiety Score 06/29/2019 04/21/2019 03/08/2019 10/20/2018  Nervous, Anxious, on Edge 3 1 2 1   Control/stop worrying 3 1 2 1   Worry too much - different things 3 1 2 1   Trouble relaxing 3 1 2 1   Restless 3 1 2 1   Easily annoyed or irritable 1 1 2 1   Afraid - awful might happen 0 0 2 0  Total GAD 7 Score  16 6 14 6   Anxiety Difficulty Somewhat difficult Somewhat difficult Somewhat difficult Somewhat difficult           Assessment & Plan:  [name] KitchenMarland KitchenYugo was seen today for hypertension.  Diagnoses and all orders for this visit:  Insomnia, unspecified type -     QUEtiapine (SEROQUEL) 25 MG tablet; Take one to two tablets at bedtime for sleep.  Cervical spinal stenosis -     traMADol (ULTRAM) 50 MG tablet; Take 1 tablet (50 mg total) by mouth every 8 (eight) hours as needed for severe pain. -     DULoxetine (CYMBALTA) 30 MG capsule; Take one tablet daily. -     meloxicam (MOBIC) 15 MG tablet; Take 1 tablet (15 mg total) by mouth daily. -     pregabalin (LYRICA) 100 MG capsule; Take 1 capsule (100 mg total) by mouth 2 (two) times daily.  Chronic pain  syndrome -     traMADol (ULTRAM) 50 MG tablet; Take 1 tablet (50 mg total) by mouth every 8 (eight) hours as needed for severe pain. -     DULoxetine (CYMBALTA) 30 MG capsule; Take one tablet daily. -     pregabalin (LYRICA) 100 MG capsule; Take 1 capsule (100 mg total) by mouth 2 (two) times daily. -     Pain Mgmt, Profile 6 Conf w/o mM, U  Moderate episode of recurrent major depressive disorder (HCC) -     DULoxetine (CYMBALTA) 30 MG capsule; Take one tablet daily. -     TSH  GAD (generalized anxiety disorder) -     DULoxetine (CYMBALTA) 30 MG capsule; Take one tablet daily. -     TSH  Essential hypertension -     TSH  Carpal tunnel syndrome, bilateral -     meloxicam (MOBIC) 15 MG tablet; Take 1 tablet (15 mg total) by mouth daily.  Screening for lipid disorders -     Lipid Panel w/reflex Direct LDL  Atypical chest pain -     TSH -     CBC w/Diff -     losartan (COZAAR) 100 MG tablet; Take 1 tablet (100 mg total) by mouth daily.   BP not to goal. ACE cough. Stop lisionpril. Start losaartan. Recheck in 1 month.   Stop temazepam. Start seroquel. Follow up in 1 month.   Continue mobic, lyrica.   Needs labs.   [name] KitchenPDMP reviewed during this encounter.  No concerns. Tramadol refilled.  Continue followup with surgeon.

## 2019-11-14 NOTE — Patient Instructions (Signed)
Stop temazepam. Start seroquel.  Changed and increased to losaartan.  Reach out for counseling.

## 2019-11-16 LAB — PAIN MGMT, PROFILE 6 CONF W/O MM, U
6 Acetylmorphine: NEGATIVE ng/mL
Alcohol Metabolites: POSITIVE ng/mL — AB (ref <500)
Alphahydroxyalprazolam: NEGATIVE ng/mL
Alphahydroxymidazolam: NEGATIVE ng/mL
Alphahydroxytriazolam: NEGATIVE ng/mL
Aminoclonazepam: NEGATIVE ng/mL
Amphetamines: NEGATIVE ng/mL
Barbiturates: NEGATIVE ng/mL
Benzodiazepines: POSITIVE ng/mL
Cocaine Metabolite: NEGATIVE ng/mL
Creatinine: 174.1 mg/dL
Ethyl Glucuronide (ETG): 36486 ng/mL
Ethyl Sulfate (ETS): 6765 ng/mL
Hydroxyethylflurazepam: NEGATIVE ng/mL
Lorazepam: NEGATIVE ng/mL
Marijuana Metabolite: NEGATIVE ng/mL
Methadone Metabolite: NEGATIVE ng/mL
Nordiazepam: NEGATIVE ng/mL
Opiates: NEGATIVE ng/mL
Oxazepam: 441 ng/mL
Oxidant: NEGATIVE ug/mL
Oxycodone: NEGATIVE ng/mL
Phencyclidine: NEGATIVE ng/mL
Temazepam: >8000 ng/mL
pH: 6.3 (ref 4.5–9.0)

## 2019-11-17 NOTE — Progress Notes (Signed)
[facility],   Drug screen ok other than alcohol detected. According to the pain contract you are not supposed to drink alcohol with pain medication. The combination is not recommended. Keep that in mind at your office visits here where we do drug screens. Everything stays the same for now.   -Luvenia Starch

## 2019-11-19 ENCOUNTER — Other Ambulatory Visit: Payer: Self-pay | Admitting: Physician Assistant

## 2019-11-19 DIAGNOSIS — F331 Major depressive disorder, recurrent, moderate: Secondary | ICD-10-CM

## 2019-11-19 DIAGNOSIS — F411 Generalized anxiety disorder: Secondary | ICD-10-CM

## 2019-11-19 DIAGNOSIS — G894 Chronic pain syndrome: Secondary | ICD-10-CM

## 2019-11-19 DIAGNOSIS — M4802 Spinal stenosis, cervical region: Secondary | ICD-10-CM

## 2019-11-20 ENCOUNTER — Encounter: Payer: Self-pay | Admitting: Physician Assistant

## 2019-12-10 ENCOUNTER — Other Ambulatory Visit: Payer: Self-pay | Admitting: Physician Assistant

## 2019-12-10 DIAGNOSIS — R0789 Other chest pain: Secondary | ICD-10-CM

## 2019-12-10 DIAGNOSIS — G4700 Insomnia, unspecified: Secondary | ICD-10-CM

## 2019-12-13 ENCOUNTER — Telehealth (INDEPENDENT_AMBULATORY_CARE_PROVIDER_SITE_OTHER): Payer: 59 | Admitting: Physician Assistant

## 2019-12-13 VITALS — HR 80 | Ht 73.0 in | Wt 227.0 lb

## 2019-12-13 DIAGNOSIS — M4802 Spinal stenosis, cervical region: Secondary | ICD-10-CM | POA: Diagnosis not present

## 2019-12-13 DIAGNOSIS — F411 Generalized anxiety disorder: Secondary | ICD-10-CM

## 2019-12-13 DIAGNOSIS — M7712 Lateral epicondylitis, left elbow: Secondary | ICD-10-CM

## 2019-12-13 DIAGNOSIS — G894 Chronic pain syndrome: Secondary | ICD-10-CM | POA: Diagnosis not present

## 2019-12-13 DIAGNOSIS — F331 Major depressive disorder, recurrent, moderate: Secondary | ICD-10-CM

## 2019-12-13 DIAGNOSIS — F5105 Insomnia due to other mental disorder: Secondary | ICD-10-CM

## 2019-12-13 NOTE — Progress Notes (Signed)
Sleep update: Seroquel makes him sleep 15+ hours.  He doesn't take it very often.  He states he can go to sleep but will wake up in the middle of the night - still having issues.   Also - injection in elbow a couple months ago which worked well. Has been using flex bar and now having a lot of pain again, can hardly tie his shoes.

## 2019-12-13 NOTE — Progress Notes (Signed)
Patient ID: Thomas Jennings, male   DOB: 01/19/1981, 39 y.o.   MRN: [id number] .[name] KitchenVirtual Visit via Video Note  I connected with Ellie Lunch on 12/13/2019 at  8:10 AM EST by a video enabled telemedicine application and verified that I am speaking with the correct person using two identifiers.  Location: Patient: home Provider: clinic   I discussed the limitations of evaluation and management by telemedicine and the availability of in person appointments. The patient expressed understanding and agreed to proceed.  History of Present Illness: Pt is a 39 yo male who calls into the clinic to follow up on seroquel for sleep.   He is doing much better with sleep. 1/2 tablet of seroquel and he sleeps through the night. If he takes a whole tablet he is too drowsy in am.   Dr. Katherine Roan neurosurgery and had MRI but has not been called.   He does admit his left elbow pain has flared back up. He had injection in October and helped for a while.     IMPRESSION:     C4-5: Severe degenerative changes, including a broad posterior disc-osteophyte complex that is eccentrically larger on the right side. This is continuous with bilateral uncovertebral osteophytes. Slight retrolisthesis. Severe spinal canal stenosis (right  greater than left) with spinal cord compression, albeit chronic. No definite abnormal spinal cord signal. Severe bilateral neural foraminal stenosis. These degenerative changes have progressed mildly since 08/15/2017.    C5-6: Relatively severe degenerative changes at this level as well, worse on the right side. This includes a right-sided posterior disc-osteophyte complex that is continuous with a large right uncovertebral osteophyte. Severe right-sided spinal canal   stenosis with indentation of the right ventral spinal cord. Moderately severe central and left-sided spinal canal stenosis. Severe right and moderately severe left neural foraminal stenosis. Appearance similar to prior.     Milder degenerative changes at other levels.  .. Active Ambulatory Problems    Diagnosis Date Noted  . Cervical spinal stenosis 03/09/2017  . Elevated blood pressure reading 03/09/2017  . SOB (shortness of breath) 03/09/2017  . GAD (generalized anxiety disorder) 03/09/2017  . Insomnia due to mental disorder 03/09/2017  . Essential hypertension 05/11/2017  . History of diverticulitis 08/05/2017  . Atypical chest pain 08/05/2017  . Gastroesophageal reflux disease with esophagitis 08/05/2017  . Moderate episode of recurrent major depressive disorder ([place]) 08/05/2017  . Carpal tunnel syndrome, bilateral 02/02/2018  . Drug-induced erectile dysfunction 04/23/2018  . Chronic pain syndrome 07/16/2018  . Lipoma of torso 10/21/2018  . Blood in stool 10/21/2018  . Pancreatic mass 06/29/2019  . Lower abdominal pain 06/29/2019  . Lateral epicondylitis, left elbow 08/09/2019   Resolved Ambulatory Problems    Diagnosis Date Noted  . Cervical spondylosis with myelopathy and radiculopathy 03/09/2017   Past Medical History:  Diagnosis Date  . Primary insomnia 03/09/2017   Reviewed med, allergy, problem list.    Observations/Objective: No acute distress. Normal mood and appearance.   .. Today's Vitals   12/13/19 0801  Pulse: 80  Weight: 227 lb (103 kg)  Height: 6\' 1"  (1.854 m)   Body mass index is 29.95 kg/m.    Assessment and Plan: [name] KitchenMarland KitchenEivin was seen today for insomnia.  Diagnoses and all orders for this visit:  Insomnia due to mental disorder  Chronic pain syndrome  Cervical spinal stenosis  GAD (generalized anxiety disorder)  Moderate episode of recurrent major depressive disorder (HCC)  Lateral epicondylitis, left elbow   He has plenty of seroquel. Continue  using 1/2 tablet nightly for sleep.   Continue lyrcia and cymbalta and tramadol for pain. Call neurosurgeon to find out next steps on MRI.   Hx of tennis elbow. Wear brace. Consider injection if worsens.  Ice area. Use voltaren gel. Could be some worsening radicular pain from neck as well. Follow up with sports medicine.    Follow Up Instructions:    I discussed the assessment and treatment plan with the patient. The patient was provided an opportunity to ask questions and all were answered. The patient agreed with the plan and demonstrated an understanding of the instructions.   The patient was advised to call back or seek an in-person evaluation if the symptoms worsen or if the condition fails to improve as anticipated.  I provided 17 minutes of non-face-to-face time during this encounter.   Iran Planas, PA-C

## 2019-12-23 ENCOUNTER — Other Ambulatory Visit: Payer: Self-pay

## 2019-12-23 DIAGNOSIS — G894 Chronic pain syndrome: Secondary | ICD-10-CM

## 2019-12-23 DIAGNOSIS — M4802 Spinal stenosis, cervical region: Secondary | ICD-10-CM

## 2019-12-23 MED ORDER — TRAMADOL HCL 50 MG PO TABS: 50.0000 mg | ORAL_TABLET | Freq: Three times a day (TID) | ORAL | 0 refills | Status: DC | PRN

## 2019-12-23 NOTE — Telephone Encounter (Signed)
Taro requests a refill on Tramadol.

## 2020-01-14 ENCOUNTER — Other Ambulatory Visit: Payer: Self-pay | Admitting: Physician Assistant

## 2020-01-14 DIAGNOSIS — R0789 Other chest pain: Secondary | ICD-10-CM

## 2020-01-20 ENCOUNTER — Other Ambulatory Visit: Payer: Self-pay

## 2020-01-20 DIAGNOSIS — M4802 Spinal stenosis, cervical region: Secondary | ICD-10-CM

## 2020-01-20 DIAGNOSIS — G894 Chronic pain syndrome: Secondary | ICD-10-CM

## 2020-01-20 MED ORDER — TRAMADOL HCL 50 MG PO TABS: 50.0000 mg | ORAL_TABLET | Freq: Three times a day (TID) | ORAL | 0 refills | Status: DC | PRN

## 2020-02-17 ENCOUNTER — Other Ambulatory Visit: Payer: Self-pay

## 2020-02-17 DIAGNOSIS — G894 Chronic pain syndrome: Secondary | ICD-10-CM

## 2020-02-17 DIAGNOSIS — M4802 Spinal stenosis, cervical region: Secondary | ICD-10-CM

## 2020-02-17 MED ORDER — TRAMADOL HCL 50 MG PO TABS: 50.0000 mg | ORAL_TABLET | Freq: Three times a day (TID) | ORAL | 0 refills | Status: DC | PRN

## 2020-02-17 NOTE — Telephone Encounter (Signed)
Thomas Jennings has a follow up scheduled for next week.

## 2020-02-23 ENCOUNTER — Other Ambulatory Visit: Payer: Self-pay

## 2020-02-23 ENCOUNTER — Encounter: Payer: Self-pay | Admitting: Physician Assistant

## 2020-02-23 ENCOUNTER — Ambulatory Visit: Payer: 59 | Admitting: Physician Assistant

## 2020-02-23 VITALS — BP 128/88 | HR 73 | Ht 73.0 in | Wt 230.0 lb

## 2020-02-23 DIAGNOSIS — I10 Essential (primary) hypertension: Secondary | ICD-10-CM

## 2020-02-23 DIAGNOSIS — M4802 Spinal stenosis, cervical region: Secondary | ICD-10-CM | POA: Diagnosis not present

## 2020-02-23 DIAGNOSIS — G894 Chronic pain syndrome: Secondary | ICD-10-CM

## 2020-02-23 MED ORDER — LOSARTAN POTASSIUM-HCTZ 100-12.5 MG PO TABS: 1.0000 | ORAL_TABLET | Freq: Every day | ORAL | 0 refills | Status: DC

## 2020-02-23 NOTE — Progress Notes (Signed)
Still having issues with back, can't be out of work until fall/winter, wanted to discuss Having issues with left arm again, can hardly tie shoes, he did have injection with Dr. Georgina Snell which helped but was >$1000 after insurance, any alternative?

## 2020-02-23 NOTE — Progress Notes (Signed)
   Subjective:    Patient ID: Thomas Jennings, male    DOB: 02/05/1981, 39 y.o.   MRN: [id number]  HPI Pt is a 39 yo male with HTN, Chronic neck pain and radiculopathy due to cervical stenosis, insomnia, MDD, anxiety who presents to the clinic for medication refill.   He comes in every 3 months for tramadol refill. His pain continues to worsen. He has numbness and tingling of both arms and hands. He struggles at times just tieing his shoes. It is becoming progressively hard to work and lift. He never heard back after MRI with neurosurgery.   His mood is ok. He still has problems sleeping due to pain.  .. Active Ambulatory Problems    Diagnosis Date Noted  . Cervical spinal stenosis 03/09/2017  . Elevated blood pressure reading 03/09/2017  . SOB (shortness of breath) 03/09/2017  . GAD (generalized anxiety disorder) 03/09/2017  . Insomnia due to mental disorder 03/09/2017  . Essential hypertension 05/11/2017  . History of diverticulitis 08/05/2017  . Atypical chest pain 08/05/2017  . Gastroesophageal reflux disease with esophagitis 08/05/2017  . Moderate episode of recurrent major depressive disorder ([place]) 08/05/2017  . Carpal tunnel syndrome, bilateral 02/02/2018  . Drug-induced erectile dysfunction 04/23/2018  . Chronic pain syndrome 07/16/2018  . Lipoma of torso 10/21/2018  . Blood in stool 10/21/2018  . Pancreatic mass 06/29/2019  . Lower abdominal pain 06/29/2019  . Lateral epicondylitis, left elbow 08/09/2019   Resolved Ambulatory Problems    Diagnosis Date Noted  . Cervical spondylosis with myelopathy and radiculopathy 03/09/2017   Past Medical History:  Diagnosis Date  . Primary insomnia 03/09/2017      Review of Systems    see HPI.  Objective:   Physical Exam Vitals reviewed.  Constitutional:      Appearance: Normal appearance.  HENT:     Head: Normocephalic.  Cardiovascular:     Rate and Rhythm: Normal rate and regular rhythm.     Pulses: Normal pulses.   Pulmonary:     Effort: Pulmonary effort is normal.  Neurological:     General: No focal deficit present.     Mental Status: He is alert and oriented to person, place, and time.  Psychiatric:        Mood and Affect: Mood normal.           Assessment & Plan:  [name] KitchenMarland KitchenAlyster was seen today for follow-up.  Diagnoses and all orders for this visit:  Essential hypertension -     losartan-hydrochlorothiazide (HYZAAR) 100-12.5 MG tablet; Take 1 tablet by mouth daily.  Cervical spinal stenosis  Chronic pain syndrome  BP to goal. BP meds refilled.   Pain contract UTD. [name].PDMP reviewed during this encounter. Refilled recently.  [place] for 3 months of refills.  [place] for refills of tramadol for the next 3 months.  Needs to make appt with neurosurgery to discussed surgical options. Printed MrI and gave number to call.

## 2020-03-19 ENCOUNTER — Other Ambulatory Visit: Payer: Self-pay

## 2020-03-19 DIAGNOSIS — G894 Chronic pain syndrome: Secondary | ICD-10-CM

## 2020-03-19 DIAGNOSIS — M4802 Spinal stenosis, cervical region: Secondary | ICD-10-CM

## 2020-03-19 MED ORDER — TRAMADOL HCL 50 MG PO TABS: 50.0000 mg | ORAL_TABLET | Freq: Three times a day (TID) | ORAL | 0 refills | Status: DC | PRN

## 2020-03-22 ENCOUNTER — Other Ambulatory Visit: Payer: Self-pay | Admitting: Physician Assistant

## 2020-03-22 DIAGNOSIS — N522 Drug-induced erectile dysfunction: Secondary | ICD-10-CM

## 2020-03-22 NOTE — Telephone Encounter (Signed)
Last filled 08/05/2019 #3 with 3 refills

## 2020-04-17 ENCOUNTER — Other Ambulatory Visit: Payer: Self-pay

## 2020-04-17 DIAGNOSIS — M4802 Spinal stenosis, cervical region: Secondary | ICD-10-CM

## 2020-04-17 DIAGNOSIS — G894 Chronic pain syndrome: Secondary | ICD-10-CM

## 2020-04-17 MED ORDER — TRAMADOL HCL 50 MG PO TABS: 50.0000 mg | ORAL_TABLET | Freq: Three times a day (TID) | ORAL | 0 refills | Status: DC | PRN

## 2020-04-17 NOTE — Telephone Encounter (Signed)
Patient called requesting refill on Tramadol   Last RX sent 5/17/ #90 no refills.  1 tab Q8H PRN severe pain   Last UDS and pain contract UTD  RX pended

## 2020-05-04 ENCOUNTER — Other Ambulatory Visit: Payer: Self-pay | Admitting: Physician Assistant

## 2020-05-04 DIAGNOSIS — K2100 Gastro-esophageal reflux disease with esophagitis, without bleeding: Secondary | ICD-10-CM

## 2020-05-17 ENCOUNTER — Other Ambulatory Visit: Payer: Self-pay

## 2020-05-17 DIAGNOSIS — G894 Chronic pain syndrome: Secondary | ICD-10-CM

## 2020-05-17 DIAGNOSIS — M4802 Spinal stenosis, cervical region: Secondary | ICD-10-CM

## 2020-05-17 NOTE — Telephone Encounter (Signed)
Pt called requesting a med refill for tramadol. Rx pended.   Last OV - 02/23/20 Last written - 04/17/20

## 2020-05-18 MED ORDER — TRAMADOL HCL 50 MG PO TABS: 50.0000 mg | ORAL_TABLET | Freq: Three times a day (TID) | ORAL | 0 refills | Status: DC | PRN

## 2020-05-18 NOTE — Telephone Encounter (Signed)
Task completed. Left a detailed vm msg for pt regarding med refill sent to the pharmacy. Direct call back info provided.

## 2020-05-23 ENCOUNTER — Ambulatory Visit: Payer: 59 | Admitting: Physician Assistant

## 2020-06-15 ENCOUNTER — Ambulatory Visit: Payer: 59 | Admitting: Physician Assistant

## 2020-06-15 ENCOUNTER — Other Ambulatory Visit: Payer: Self-pay | Admitting: Physician Assistant

## 2020-06-15 DIAGNOSIS — G894 Chronic pain syndrome: Secondary | ICD-10-CM

## 2020-06-15 DIAGNOSIS — M4802 Spinal stenosis, cervical region: Secondary | ICD-10-CM

## 2020-06-15 NOTE — Telephone Encounter (Signed)
Last filled 05/18/2020 #90 no refills Filled early last time due to taking more for acute pain On pain contract Last appt 02/23/2020

## 2020-06-15 NOTE — Telephone Encounter (Signed)
Patient has a follow up on Monday. He states he is out of Tramadol.

## 2020-06-18 ENCOUNTER — Ambulatory Visit (INDEPENDENT_AMBULATORY_CARE_PROVIDER_SITE_OTHER): Payer: 59 | Admitting: Physician Assistant

## 2020-06-18 ENCOUNTER — Other Ambulatory Visit: Payer: Self-pay

## 2020-06-18 DIAGNOSIS — I10 Essential (primary) hypertension: Secondary | ICD-10-CM | POA: Diagnosis not present

## 2020-06-18 DIAGNOSIS — M4802 Spinal stenosis, cervical region: Secondary | ICD-10-CM

## 2020-06-18 DIAGNOSIS — F331 Major depressive disorder, recurrent, moderate: Secondary | ICD-10-CM

## 2020-06-18 DIAGNOSIS — F411 Generalized anxiety disorder: Secondary | ICD-10-CM

## 2020-06-18 DIAGNOSIS — G4700 Insomnia, unspecified: Secondary | ICD-10-CM

## 2020-06-18 DIAGNOSIS — G5603 Carpal tunnel syndrome, bilateral upper limbs: Secondary | ICD-10-CM

## 2020-06-18 DIAGNOSIS — G894 Chronic pain syndrome: Secondary | ICD-10-CM

## 2020-06-18 MED ORDER — TRAMADOL HCL 50 MG PO TABS: ORAL_TABLET | ORAL | 0 refills | Status: DC

## 2020-06-18 MED ORDER — TRAMADOL HCL 50 MG PO TABS: 50.0000 mg | ORAL_TABLET | Freq: Three times a day (TID) | ORAL | 0 refills | Status: DC | PRN

## 2020-06-18 MED ORDER — DULOXETINE HCL 30 MG PO CPEP: ORAL_CAPSULE | ORAL | 1 refills | Status: DC

## 2020-06-18 MED ORDER — LOSARTAN POTASSIUM-HCTZ 100-12.5 MG PO TABS: 1.0000 | ORAL_TABLET | Freq: Every day | ORAL | 1 refills | Status: DC

## 2020-06-18 MED ORDER — PREGABALIN 150 MG PO CAPS: 150.0000 mg | ORAL_CAPSULE | Freq: Two times a day (BID) | ORAL | 5 refills | Status: DC

## 2020-06-18 MED ORDER — MELOXICAM 15 MG PO TABS: 15.0000 mg | ORAL_TABLET | Freq: Every day | ORAL | 5 refills | Status: DC

## 2020-06-18 MED ORDER — QUETIAPINE FUMARATE 25 MG PO TABS: ORAL_TABLET | ORAL | 1 refills | Status: DC

## 2020-06-18 NOTE — Progress Notes (Signed)
Subjective:    Patient ID: Thomas Jennings, male    DOB: 1981/04/09, 38 y.o.   MRN: [id number]  HPI Pt is a 39 yo male with cervical spinal stenosis, HTN, chronic pain, MDD, anxiety who presents to the clinic for medication refills.   He needs surgery but cannot afford to take time off work without pay. He lives by himself and does not have short term disability. He is also in custody battle with ex-wife.   He has neck and arm pain daily. Medications do help. He is very anxious and depressed at times but all around pain and his daughter. He is not able to see her as much as he would like.   .. Active Ambulatory Problems    Diagnosis Date Noted  . Cervical spinal stenosis 03/09/2017  . Elevated blood pressure reading 03/09/2017  . SOB (shortness of breath) 03/09/2017  . GAD (generalized anxiety disorder) 03/09/2017  . Insomnia due to mental disorder 03/09/2017  . Essential hypertension 05/11/2017  . History of diverticulitis 08/05/2017  . Atypical chest pain 08/05/2017  . Gastroesophageal reflux disease with esophagitis 08/05/2017  . Moderate episode of recurrent major depressive disorder ([place]) 08/05/2017  . Carpal tunnel syndrome, bilateral 02/02/2018  . Drug-induced erectile dysfunction 04/23/2018  . Chronic pain syndrome 07/16/2018  . Lipoma of torso 10/21/2018  . Blood in stool 10/21/2018  . Pancreatic mass 06/29/2019  . Lower abdominal pain 06/29/2019  . Lateral epicondylitis, left elbow 08/09/2019   Resolved Ambulatory Problems    Diagnosis Date Noted  . Cervical spondylosis with myelopathy and radiculopathy 03/09/2017   Past Medical History:  Diagnosis Date  . Primary insomnia 03/09/2017     Review of Systems  All other systems reviewed and are negative.      Objective:   Physical Exam Vitals reviewed.  Constitutional:      Appearance: Normal appearance.  Cardiovascular:     Rate and Rhythm: Normal rate and regular rhythm.     Pulses: Normal pulses.      Heart sounds: Normal heart sounds.  Pulmonary:     Effort: Pulmonary effort is normal.  Neurological:     General: No focal deficit present.     Mental Status: He is alert and oriented to person, place, and time.  Psychiatric:        Mood and Affect: Mood normal.           Assessment & Plan:  [name] KitchenMarland KitchenVicki was seen today for pain.  Diagnoses and all orders for this visit:  Cervical spinal stenosis -     meloxicam (MOBIC) 15 MG tablet; Take 1 tablet (15 mg total) by mouth daily. -     pregabalin (LYRICA) 150 MG capsule; Take 1 capsule (150 mg total) by mouth 2 (two) times daily. -     Discontinue: traMADol (ULTRAM) 50 MG tablet; Take 1 tablet (50 mg total) by mouth every 8 (eight) hours as needed for severe pain. -     DULoxetine (CYMBALTA) 30 MG capsule; TAKE 1 CAPSULE BY MOUTH EVERY DAY -     traMADol (ULTRAM) 50 MG tablet; Take 1 tablet (50 mg total) by mouth every 8 (eight) hours as needed for severe pain. -     traMADol (ULTRAM) 50 MG tablet; Take one tablet three times a day for pain. -     traMADol (ULTRAM) 50 MG tablet; Take one tablet three times a day for pain.  Carpal tunnel syndrome, bilateral -     meloxicam (MOBIC)  15 MG tablet; Take 1 tablet (15 mg total) by mouth daily. -     pregabalin (LYRICA) 150 MG capsule; Take 1 capsule (150 mg total) by mouth 2 (two) times daily. -     traMADol (ULTRAM) 50 MG tablet; Take 1 tablet (50 mg total) by mouth every 8 (eight) hours as needed for severe pain. -     traMADol (ULTRAM) 50 MG tablet; Take one tablet three times a day for pain. -     traMADol (ULTRAM) 50 MG tablet; Take one tablet three times a day for pain.  Essential hypertension -     losartan-hydrochlorothiazide (HYZAAR) 100-12.5 MG tablet; Take 1 tablet by mouth daily.  Chronic pain syndrome -     pregabalin (LYRICA) 150 MG capsule; Take 1 capsule (150 mg total) by mouth 2 (two) times daily. -     Discontinue: traMADol (ULTRAM) 50 MG tablet; Take 1 tablet (50 mg  total) by mouth every 8 (eight) hours as needed for severe pain. -     DULoxetine (CYMBALTA) 30 MG capsule; TAKE 1 CAPSULE BY MOUTH EVERY DAY -     traMADol (ULTRAM) 50 MG tablet; Take 1 tablet (50 mg total) by mouth every 8 (eight) hours as needed for severe pain. -     traMADol (ULTRAM) 50 MG tablet; Take one tablet three times a day for pain. -     traMADol (ULTRAM) 50 MG tablet; Take one tablet three times a day for pain.  Insomnia, unspecified type -     QUEtiapine (SEROQUEL) 25 MG tablet; TAKE 1-2 TABLETS AT BEDTIME FOR SLEEP  Moderate episode of recurrent major depressive disorder (HCC) -     DULoxetine (CYMBALTA) 30 MG capsule; TAKE 1 CAPSULE BY MOUTH EVERY DAY  GAD (generalized anxiety disorder) -     DULoxetine (CYMBALTA) 30 MG capsule; TAKE 1 CAPSULE BY MOUTH EVERY DAY  Other orders -     Discontinue: traMADol (ULTRAM) 50 MG tablet; Take one tablet three times a day for pain. -     Discontinue: traMADol (ULTRAM) 50 MG tablet; Take one tablet three times a day for pain.   Pt continues to need surgery but cannot afford to be out of work. Pt knows he can lose function if he does not decide to fix at some point.  Cannot tolerate any higher doses of cymbalta.  Tramadol refilled.  On mobic daily. Refilled.  lyrica increased.    [name] KitchenMarland KitchenPDMP reviewed during this encounter.   BP not to goal. Increased to combination medication. Start monitoring BP at home and report readings. Follow up in 4 weeks.   Mood is not controlled but going through a custody battle with ex-wife. He is running into a lot of problems. He declines counseling or any additional medications.   Follow up in 3 months.

## 2020-06-19 MED ORDER — TRAMADOL HCL 50 MG PO TABS: ORAL_TABLET | ORAL | 0 refills | Status: DC

## 2020-06-19 MED ORDER — TRAMADOL HCL 50 MG PO TABS: 50.0000 mg | ORAL_TABLET | Freq: Three times a day (TID) | ORAL | 0 refills | Status: DC | PRN

## 2020-06-19 NOTE — Progress Notes (Signed)
Resent

## 2020-06-19 NOTE — Progress Notes (Signed)
Ive tried this twice can we call pharmacy and find out why not going through?

## 2020-06-25 ENCOUNTER — Encounter: Payer: Self-pay | Admitting: Physician Assistant

## 2020-06-25 ENCOUNTER — Ambulatory Visit: Payer: 59 | Admitting: Sports Medicine

## 2020-09-17 ENCOUNTER — Encounter: Payer: Self-pay | Admitting: Physician Assistant

## 2020-09-17 ENCOUNTER — Ambulatory Visit (INDEPENDENT_AMBULATORY_CARE_PROVIDER_SITE_OTHER): Payer: 59 | Admitting: Physician Assistant

## 2020-09-17 ENCOUNTER — Other Ambulatory Visit: Payer: Self-pay

## 2020-09-17 VITALS — BP 155/103 | HR 86 | Ht 73.0 in | Wt 227.0 lb

## 2020-09-17 DIAGNOSIS — G4700 Insomnia, unspecified: Secondary | ICD-10-CM

## 2020-09-17 DIAGNOSIS — F39 Unspecified mood [affective] disorder: Secondary | ICD-10-CM

## 2020-09-17 DIAGNOSIS — M7712 Lateral epicondylitis, left elbow: Secondary | ICD-10-CM

## 2020-09-17 DIAGNOSIS — Z13220 Encounter for screening for lipoid disorders: Secondary | ICD-10-CM

## 2020-09-17 DIAGNOSIS — M4802 Spinal stenosis, cervical region: Secondary | ICD-10-CM

## 2020-09-17 DIAGNOSIS — F411 Generalized anxiety disorder: Secondary | ICD-10-CM

## 2020-09-17 DIAGNOSIS — G894 Chronic pain syndrome: Secondary | ICD-10-CM

## 2020-09-17 DIAGNOSIS — I10 Essential (primary) hypertension: Secondary | ICD-10-CM

## 2020-09-17 DIAGNOSIS — G5603 Carpal tunnel syndrome, bilateral upper limbs: Secondary | ICD-10-CM | POA: Diagnosis not present

## 2020-09-17 DIAGNOSIS — Z131 Encounter for screening for diabetes mellitus: Secondary | ICD-10-CM

## 2020-09-17 DIAGNOSIS — R41840 Attention and concentration deficit: Secondary | ICD-10-CM

## 2020-09-17 MED ORDER — MELOXICAM 15 MG PO TABS: 15.0000 mg | ORAL_TABLET | Freq: Every day | ORAL | 1 refills | Status: DC

## 2020-09-17 MED ORDER — TRAMADOL HCL 50 MG PO TABS: ORAL_TABLET | ORAL | 0 refills | Status: DC

## 2020-09-17 MED ORDER — AMLODIPINE BESYLATE 2.5 MG PO TABS: 2.5000 mg | ORAL_TABLET | Freq: Every day | ORAL | 2 refills | Status: DC

## 2020-09-17 MED ORDER — BUSPIRONE HCL 7.5 MG PO TABS: 7.5000 mg | ORAL_TABLET | Freq: Three times a day (TID) | ORAL | 2 refills | Status: DC

## 2020-09-17 MED ORDER — TEMAZEPAM 7.5 MG PO CAPS: 7.5000 mg | ORAL_CAPSULE | Freq: Every evening | ORAL | 2 refills | Status: DC | PRN

## 2020-09-17 MED ORDER — TRAMADOL HCL 50 MG PO TABS: 50.0000 mg | ORAL_TABLET | Freq: Three times a day (TID) | ORAL | 0 refills | Status: DC | PRN

## 2020-09-17 MED ORDER — LOSARTAN POTASSIUM-HCTZ 100-25 MG PO TABS: 1.0000 | ORAL_TABLET | Freq: Every day | ORAL | 3 refills | Status: DC

## 2020-09-17 NOTE — Progress Notes (Addendum)
Subjective:    Patient ID: Thomas Jennings, male    DOB: [birth year], 39 y.o.   MRN: [id number]  HPI  Patient is a 39 year old male with cervical final stenosis and chronic neck and arm pain, anxiety, depression, hypertension, insomnia who presents to the clinic for medication follow-up.  Patient is really struggling with his mood right now.  He feels lonely when he comes home from work.  He feels like he does not have anybody to share life with.  He is divorced and his wife is keeping his daughter from him.  This continues to be very hard on his mood.  Patient also finds that he feels like his thoughts are all over the place.  Does feel like they are better at work and better when he exercises.  He is worried about everything.  He has a lot of social anxiety and meeting people he is lonely and wants to meet people but feels so awkward socially.  He is concerned about his poor concentration and has flight of ideas.  Overall he does not feel like the medication is helping like he wants it to.  He continues to have a lot of problems sleeping.  Remeron and Seroquel both work and knock him out but leave him feeling groggy in the morning.  Trazodone did not seem to help him sleep at all.  He does have persistent chronic neck and bilateral arm pain.  He has some distinct tenderness in his left lateral arm.  He does have a air brace that he uses daily.  Patient is taking tramadol, Lyrica, Cymbalta, Mobic.  Patient denies any shortness of breath, chest pain, headache.  .. Active Ambulatory Problems    Diagnosis Date Noted  . Cervical spinal stenosis 03/09/2017  . Elevated blood pressure reading 03/09/2017  . SOB (shortness of breath) 03/09/2017  . GAD (generalized anxiety disorder) 03/09/2017  . Insomnia 03/09/2017  . Essential hypertension 05/11/2017  . History of diverticulitis 08/05/2017  . Atypical chest pain 08/05/2017  . Gastroesophageal reflux disease with esophagitis 08/05/2017  . Moderate  episode of recurrent major depressive disorder ([place]) 08/05/2017  . Carpal tunnel syndrome, bilateral 02/02/2018  . Drug-induced erectile dysfunction 04/23/2018  . Chronic pain syndrome 07/16/2018  . Lipoma of torso 10/21/2018  . Blood in stool 10/21/2018  . Pancreatic mass 06/29/2019  . Lower abdominal pain 06/29/2019  . Left lateral epicondylitis 08/09/2019   Resolved Ambulatory Problems    Diagnosis Date Noted  . Cervical spondylosis with myelopathy and radiculopathy 03/09/2017   Past Medical History:  Diagnosis Date  . Primary insomnia 03/09/2017     Review of Systems See HPI.     Objective:   Physical Exam Vitals reviewed.  Constitutional:      Appearance: Normal appearance.  Cardiovascular:     Rate and Rhythm: Normal rate and regular rhythm.     Pulses: Normal pulses.  Pulmonary:     Effort: Pulmonary effort is normal.     Breath sounds: Normal breath sounds.  Musculoskeletal:     Cervical back: Normal range of motion.     Comments: Lateral left epicondyle tenderness to palpation. No warmth or swelling.   Neurological:     General: No focal deficit present.     Mental Status: He is alert and oriented to person, place, and time.  Psychiatric:        Mood and Affect: Mood normal.       .. Depression screen [facility] 2/9 09/17/2020 02/23/2020 06/29/2019 04/21/2019  03/08/2019  Decreased Interest 0 1 1 1 1   Down, Depressed, Hopeless 1 1 1 1 1   PHQ - 2 Score 1 2 2 2 2   Altered sleeping 3 2 3 3 1   Tired, decreased energy 1 2 3 3  -  Change in appetite 1 1 0 1 1  Feeling bad or failure about yourself  0 1 0 0 1  Trouble concentrating 3 2 3 3 1   Moving slowly or fidgety/restless 0 0 0 1 0  Suicidal thoughts 0 0 0 0 0  PHQ-9 Score 9 10 11 13 6   Difficult doing work/chores Somewhat difficult Somewhat difficult Somewhat difficult Somewhat difficult Somewhat difficult  Some recent data might be hidden   .[name] GAD 7 : Generalized Anxiety Score 09/17/2020 02/23/2020 06/29/2019  04/21/2019  Nervous, Anxious, on Edge 3 2 3 1   Control/stop worrying 3 2 3 1   Worry too much - different things 3 2 3 1   Trouble relaxing 3 2 3 1   Restless 3 2 3 1   Easily annoyed or irritable 1 1 1 1   Afraid - awful might happen 0 0 0 0  Total GAD 7 Score 16 11 16 6   Anxiety Difficulty Very difficult Somewhat difficult Somewhat difficult Somewhat difficult    .[name] Adult ADHD Self Report Scale (most recent)    Adult ADHD Self-Report Scale (ASRS-v1.1) Symptom Checklist - 09/17/20 1100      Part A   1. How often do you have trouble wrapping up the final details of a project, once the challenging parts have been done? Sometimes  2. How often do you have difficulty getting things done in order when you have to do a task that requires organization? Very Often    3. How often do you have problems remembering appointments or obligations? Very Often  4. When you have a task that requires a lot of thought, how often do you avoid or delay getting started? Very Often    5. How often do you fidget or squirm with your hands or feet when you have to sit down for a long time? Often  6. How often do you feel overly active and compelled to do things, like you were driven by a motor? Often      Part B   7. How often do you make careless mistakes when you have to work on a boring or difficult project? Sometimes  8. How often do you have difficulty keeping your attention when you are doing boring or repetitive work? Very Often    9. How often do you have difficulty concentrating on what people say to you, even when they are speaking to you directly? Sometimes  10. How often do you misplace or have difficulty finding things at home or at work? Sometimes    11. How often are you distracted by activity or noise around you? Very Often  11. How often do you leave your seat in meetings or other situations in which you are expected to remain seated? Rarely    13. How often do you feel restless or fidgety? Very Often  41.  How often do you have difficulty unwinding and relaxing when you have time to yourself? Very Often    25. How often do you find yourself talking too much when you are in social situations? Sometimes  16. When you are in a conversation, how often do you find yourself finishing the sentences of the people you are talking to, before they can  finish them themselves? Sometimes    17. How often do you have difficulty waiting your turn in situations when turn taking is required? Often  18. How often do you interrupt others when they are busy? Rarely          MDQ-concerning yes to spending Monday and gambling.  9/13.     Assessment & Plan:  [name] KitchenMarland KitchenLois was seen today for pain management.  Diagnoses and all orders for this visit:  Essential hypertension -     losartan-hydrochlorothiazide (HYZAAR) 100-25 MG tablet; Take 1 tablet by mouth daily. -     amLODipine (NORVASC) 2.5 MG tablet; Take 1 tablet (2.5 mg total) by mouth daily.  Cervical spinal stenosis -     traMADol (ULTRAM) 50 MG tablet; Take 1 tablet (50 mg total) by mouth every 8 (eight) hours as needed for severe pain. -     traMADol (ULTRAM) 50 MG tablet; Take one tablet three times a day for pain. -     traMADol (ULTRAM) 50 MG tablet; Take one tablet three times a day for pain. -     meloxicam (MOBIC) 15 MG tablet; Take 1 tablet (15 mg total) by mouth daily.  Chronic pain syndrome -     traMADol (ULTRAM) 50 MG tablet; Take 1 tablet (50 mg total) by mouth every 8 (eight) hours as needed for severe pain. -     traMADol (ULTRAM) 50 MG tablet; Take one tablet three times a day for pain. -     traMADol (ULTRAM) 50 MG tablet; Take one tablet three times a day for pain. -     CBC with Differential/Platelet  Carpal tunnel syndrome, bilateral -     traMADol (ULTRAM) 50 MG tablet; Take 1 tablet (50 mg total) by mouth every 8 (eight) hours as needed for severe pain. -     traMADol (ULTRAM) 50 MG tablet; Take one tablet three times a day for  pain. -     traMADol (ULTRAM) 50 MG tablet; Take one tablet three times a day for pain. -     meloxicam (MOBIC) 15 MG tablet; Take 1 tablet (15 mg total) by mouth daily.  GAD (generalized anxiety disorder) -     busPIRone (BUSPAR) 7.5 MG tablet; Take 1 tablet (7.5 mg total) by mouth 3 (three) times daily. -     TSH  Left lateral epicondylitis  Screening for lipid disorders -     Lipid Panel w/reflex Direct LDL  Screening for diabetes mellitus -     COMPLETE METABOLIC PANEL WITH GFR  Insomnia, unspecified type -     temazepam (RESTORIL) 7.5 MG capsule; Take 1 capsule (7.5 mg total) by mouth at bedtime as needed for sleep.   BP is very elevated even on 2nd recheck. Increased hyzaar and added low dose norvasc. Follow up in 4 weeks.   Mood disorder vs ADHD/ADD vs Anxiety. Pt was too sleepy with both remeron and seroquel which leads me to think at such low dosages this is not mood disorder. ADD not willing to treat due to insomnia. I do think anxiety is the biggest problem.   Added buspar three times a day and temazepam at bedtime.   We certainly need to get patient sleeping in hopes of any decrease in anxiety.   Encouraged counseling. Pt declines today.   We could consider psychology testing to tease out mood disorder/ADHD/ vs anxiety.   Continues to need surgery for chronic neck pain/cervical stenosis. Pt is trying to  work things out for time off etc.   Chronic pain:  .[name] KitchenPDMP reviewed during this encounter. Sent 3 months of tramadol.  UTD pain contract.  No concerns.  Continue mobic/lyrica/cymbalta.   Left tennis elbow. See Dr. Darene Lamer for injection. Ice/NSAID and wear breace.

## 2020-09-17 NOTE — Patient Instructions (Signed)
Start buspar three times a day.  Start restoril at bedtime.  Start norvasc for blood pressure

## 2020-09-18 ENCOUNTER — Ambulatory Visit: Payer: 59 | Admitting: Physician Assistant

## 2020-10-09 ENCOUNTER — Other Ambulatory Visit: Payer: Self-pay | Admitting: Physician Assistant

## 2020-10-09 DIAGNOSIS — F411 Generalized anxiety disorder: Secondary | ICD-10-CM

## 2020-10-15 ENCOUNTER — Ambulatory Visit (INDEPENDENT_AMBULATORY_CARE_PROVIDER_SITE_OTHER): Payer: 59 | Admitting: Physician Assistant

## 2020-10-15 VITALS — BP 160/95 | HR 100 | Ht 73.0 in | Wt 224.0 lb

## 2020-10-15 DIAGNOSIS — G4700 Insomnia, unspecified: Secondary | ICD-10-CM | POA: Diagnosis not present

## 2020-10-15 DIAGNOSIS — I10 Essential (primary) hypertension: Secondary | ICD-10-CM | POA: Diagnosis not present

## 2020-10-15 DIAGNOSIS — F411 Generalized anxiety disorder: Secondary | ICD-10-CM | POA: Diagnosis not present

## 2020-10-15 DIAGNOSIS — R41840 Attention and concentration deficit: Secondary | ICD-10-CM

## 2020-10-15 DIAGNOSIS — F331 Major depressive disorder, recurrent, moderate: Secondary | ICD-10-CM

## 2020-10-15 MED ORDER — AMLODIPINE BESYLATE 5 MG PO TABS: 5.0000 mg | ORAL_TABLET | Freq: Every day | ORAL | 3 refills | Status: DC

## 2020-10-15 MED ORDER — TEMAZEPAM 7.5 MG PO CAPS: 7.5000 mg | ORAL_CAPSULE | Freq: Every evening | ORAL | 0 refills | Status: DC | PRN

## 2020-10-15 MED ORDER — BUPROPION HCL ER (SR) 100 MG PO TB12: 100.0000 mg | ORAL_TABLET | Freq: Two times a day (BID) | ORAL | 0 refills | Status: DC

## 2020-10-15 MED ORDER — BUSPIRONE HCL 15 MG PO TABS: 15.0000 mg | ORAL_TABLET | Freq: Two times a day (BID) | ORAL | 0 refills | Status: DC

## 2020-10-15 NOTE — Patient Instructions (Signed)
restoril at bedtime Increased norvasc Increased buspar twice a day  Added wellbutrin twice a day

## 2020-10-15 NOTE — Progress Notes (Addendum)
Established Patient Office Visit  Subjective:  Patient ID: Thomas Jennings, male    DOB: Oct 12, 1981  Age: 39 y.o. MRN: [id number]  CC:  Chief Complaint  Patient presents with  . Hypertension    HPI Thomas Jennings presents for follow up for hypertension. Pleasant male in NAD, well-nourished. PMH of essential HTN, GERD, left epicondylitis (lateral), cervical spinal stenosis, anxiety, chronic pain, MDD.  At last visit patient had symptoms of anxiety and insomnia. Started on buspar and restoril. Restoril is improving sleep according to patient. Patient hasn't noticed a difference in mood since starting buspar.  Today patient reports difficulties using computer screens/tablets at work. He feels overwhelmed when looking at the screen and having to use the touch screen functions. Patient describes difficulty focusing on one thing and finishing a task to completion. Patient says symptoms worsen with stress and increased mental demand of his job. Patient says he enjoys reading but hasn't been able to focus long enough to read.  Patient is open to a psychologist evaluation for ADHD/depression/etc but expresses concerns over finances and paying for the visit.  Patient completed blood work ordered at last visit this morning.   Past Medical History:  Diagnosis Date  . Cervical spondylosis with myelopathy and radiculopathy 03/09/2017  . GAD (generalized anxiety disorder) 03/09/2017  . Primary insomnia 03/09/2017  . SOB (shortness of breath) 03/09/2017    No past surgical history on file.  Family History  Problem Relation Age of Onset  . Dementia Father   . Alcohol abuse Father   . Mental illness Mother     Outpatient Medications Prior to Visit  Medication Sig Dispense Refill  . amLODipine (NORVASC) 2.5 MG tablet Take 1 tablet (2.5 mg total) by mouth daily. 30 tablet 2  . busPIRone (BUSPAR) 7.5 MG tablet TAKE 1 TABLET BY MOUTH 3 TIMES DAILY. 270 tablet 0  . diclofenac sodium (VOLTAREN) 1 % GEL  Apply 4 g topically 4 (four) times daily. To affected joint. 100 g 11  . DULoxetine (CYMBALTA) 30 MG capsule TAKE 1 CAPSULE BY MOUTH EVERY DAY 90 capsule 1  . losartan-hydrochlorothiazide (HYZAAR) 100-25 MG tablet Take 1 tablet by mouth daily. 90 tablet 3  . meloxicam (MOBIC) 15 MG tablet Take 1 tablet (15 mg total) by mouth daily. 90 tablet 1  . omeprazole (PRILOSEC) 40 MG capsule TAKE 1 CAPSULE BY MOUTH EVERY DAY 90 capsule 3  . pregabalin (LYRICA) 150 MG capsule Take 1 capsule (150 mg total) by mouth 2 (two) times daily. 60 capsule 5  . sildenafil (VIAGRA) 100 MG tablet TAKE 1 TABLET EVERY DAY (PRIOR TO SEXUAL ACTIVITY) AS NEEDED FOR ERECTILE DYSFUNCTION 6 tablet 3  . temazepam (RESTORIL) 7.5 MG capsule Take 1 capsule (7.5 mg total) by mouth at bedtime as needed for sleep. 30 capsule 2  . traMADol (ULTRAM) 50 MG tablet Take 1 tablet (50 mg total) by mouth every 8 (eight) hours as needed for severe pain. 90 tablet 0  . [START ON 10/16/2020] traMADol (ULTRAM) 50 MG tablet Take one tablet three times a day for pain. 90 tablet 0  . [START ON 11/15/2020] traMADol (ULTRAM) 50 MG tablet Take one tablet three times a day for pain. 90 tablet 0   No facility-administered medications prior to visit.    Allergies  Allergen Reactions  . Gabapentin     dizzy  . Lisinopril     Ace cough   ROS Review of Systems  All other systems reviewed and are negative.  Objective:    Physical Exam Vitals reviewed.  Constitutional:      Appearance: Normal appearance.  HENT:     Head: Normocephalic.  Cardiovascular:     Rate and Rhythm: Normal rate and regular rhythm.     Pulses: Normal pulses.     Heart sounds: Normal heart sounds.  Pulmonary:     Effort: Pulmonary effort is normal.     Breath sounds: Normal breath sounds.  Neurological:     General: No focal deficit present.     Mental Status: He is alert and oriented to person, place, and time.  Psychiatric:        Mood and Affect: Mood  normal.     BP (!) 160/95   Pulse 100   Ht 6\' 1"  (1.854 m)   Wt 224 lb (101.6 kg)   BMI 29.55 kg/m  Wt Readings from Last 3 Encounters:  10/15/20 224 lb (101.6 kg)  09/17/20 227 lb (103 kg)  06/18/20 222 lb (100.7 kg)     Assessment & Plan:  Essential Hypertension - BP still elevated in office today x 2 readings (160/95 and 166/80) - Increase amlodipine to 5mg  PO once daily Anxiety - Increase buspar to 15mg  PO BID - Increase restoril to 7.5mg  PO at bedtime PRN for sleep Inattentive - Certainly could be related to ADHD although patient has never officially been diagnosed. Start bupropion 100mg  PO BID  Chronic Pain - Patient has hx of cervical spinal stenosis and requires surgical intervention but this has been delayed due to insurance issues. Likely that pain and systemic inflammation from orthopaedic conditions is elevating his BP and worsening his mood. Educated patient on connection between mood and pain.  - Continue PRN tramadol, meloxicam, lyrica as directed  Follow-up: Follow up in one month to re-check BP and symptoms after med adjustments. Follow up sooner as needed for new or worsening symptoms.   [name] KitchenVernetta Honey PA-C, have reviewed and agree with the above documentation in it's entirety.

## 2020-10-16 ENCOUNTER — Encounter: Payer: Self-pay | Admitting: Physician Assistant

## 2020-10-16 DIAGNOSIS — R7401 Elevation of levels of liver transaminase levels: Secondary | ICD-10-CM | POA: Insufficient documentation

## 2020-10-16 LAB — LIPID PANEL W/REFLEX DIRECT LDL
Cholesterol: 231 mg/dL — ABNORMAL HIGH (ref <200)
HDL: 75 mg/dL (ref >=40)
LDL Cholesterol (Calc): 125 mg/dL (calc) — ABNORMAL HIGH
Non-HDL Cholesterol (Calc): 156 mg/dL (calc) — ABNORMAL HIGH (ref <130)
Total CHOL/HDL Ratio: 3.1 (calc) (ref <5.0)
Triglycerides: 195 mg/dL — ABNORMAL HIGH (ref <150)

## 2020-10-16 LAB — COMPLETE METABOLIC PANEL WITH GFR
AG Ratio: 1.8 (calc) (ref 1.0–2.5)
ALT: 82 U/L — ABNORMAL HIGH (ref 9–46)
AST: 40 U/L (ref 10–40)
Albumin: 4.7 g/dL (ref 3.6–5.1)
Alkaline phosphatase (APISO): 51 U/L (ref 36–130)
BUN: 11 mg/dL (ref 7–25)
CO2: 28 mmol/L (ref 20–32)
Calcium: 9.6 mg/dL (ref 8.6–10.3)
Chloride: 102 mmol/L (ref 98–110)
Creat: 0.77 mg/dL (ref 0.60–1.35)
GFR, Est African American: 131 mL/min/{1.73_m2} (ref >=60)
GFR, Est Non African American: 113 mL/min/{1.73_m2} (ref >=60)
Globulin: 2.6 g/dL (calc) (ref 1.9–3.7)
Glucose, Bld: 85 mg/dL (ref 65–99)
Potassium: 4.2 mmol/L (ref 3.5–5.3)
Sodium: 141 mmol/L (ref 135–146)
Total Bilirubin: 0.6 mg/dL (ref 0.2–1.2)
Total Protein: 7.3 g/dL (ref 6.1–8.1)

## 2020-10-16 LAB — CBC WITH DIFFERENTIAL/PLATELET
Absolute Monocytes: 483 cells/uL (ref 200–950)
Basophils Absolute: 60 cells/uL (ref 0–200)
Basophils Relative: 1.3 %
Eosinophils Absolute: 143 cells/uL (ref 15–500)
Eosinophils Relative: 3.1 %
HCT: 45.2 % (ref 38.5–50.0)
Hemoglobin: 16.1 g/dL (ref 13.2–17.1)
Lymphs Abs: 1454 cells/uL (ref 850–3900)
MCH: 30.6 pg (ref 27.0–33.0)
MCHC: 35.6 g/dL (ref 32.0–36.0)
MCV: 85.8 fL (ref 80.0–100.0)
MPV: 10.3 fL (ref 7.5–12.5)
Monocytes Relative: 10.5 %
Neutro Abs: 2461 cells/uL (ref 1500–7800)
Neutrophils Relative %: 53.5 %
Platelets: 244 10*3/uL (ref 140–400)
RBC: 5.27 10*6/uL (ref 4.20–5.80)
RDW: 12.1 % (ref 11.0–15.0)
Total Lymphocyte: 31.6 %
WBC: 4.6 10*3/uL (ref 3.8–10.8)

## 2020-10-16 LAB — TSH: TSH: 1.22 mIU/L (ref 0.40–4.50)

## 2020-10-16 NOTE — Progress Notes (Signed)
Jenesis,   Kidney, glucose thyroid look good.  Good cholesterol GREAT. Bad cholesterol just a little elevated along with triglycerides. Avoid sugary, processed fats.  One liver enzymes is a little elevated. You are on tramadol. Are you taking any extra tylenol with this? If so please stop. Are you drinking any alcohol? If so stop and lets recheck in 4 weeks.

## 2020-10-18 ENCOUNTER — Other Ambulatory Visit: Payer: Self-pay | Admitting: Neurology

## 2020-10-18 DIAGNOSIS — R748 Abnormal levels of other serum enzymes: Secondary | ICD-10-CM

## 2020-11-08 ENCOUNTER — Other Ambulatory Visit: Payer: Self-pay | Admitting: Physician Assistant

## 2020-11-12 ENCOUNTER — Ambulatory Visit: Payer: 59 | Admitting: Physician Assistant

## 2020-11-16 ENCOUNTER — Encounter: Payer: Self-pay | Admitting: Physician Assistant

## 2020-11-16 ENCOUNTER — Other Ambulatory Visit: Payer: Self-pay

## 2020-11-16 ENCOUNTER — Ambulatory Visit (INDEPENDENT_AMBULATORY_CARE_PROVIDER_SITE_OTHER): Payer: 59 | Admitting: Physician Assistant

## 2020-11-16 VITALS — BP 139/91 | HR 87 | Wt 224.0 lb

## 2020-11-16 DIAGNOSIS — G5603 Carpal tunnel syndrome, bilateral upper limbs: Secondary | ICD-10-CM

## 2020-11-16 DIAGNOSIS — F411 Generalized anxiety disorder: Secondary | ICD-10-CM

## 2020-11-16 DIAGNOSIS — G894 Chronic pain syndrome: Secondary | ICD-10-CM | POA: Diagnosis not present

## 2020-11-16 DIAGNOSIS — R41840 Attention and concentration deficit: Secondary | ICD-10-CM

## 2020-11-16 DIAGNOSIS — F331 Major depressive disorder, recurrent, moderate: Secondary | ICD-10-CM

## 2020-11-16 DIAGNOSIS — G4700 Insomnia, unspecified: Secondary | ICD-10-CM

## 2020-11-16 DIAGNOSIS — M4802 Spinal stenosis, cervical region: Secondary | ICD-10-CM

## 2020-11-16 MED ORDER — TRAMADOL HCL 50 MG PO TABS: ORAL_TABLET | ORAL | 0 refills | Status: DC

## 2020-11-16 MED ORDER — BUPROPION HCL ER (SR) 150 MG PO TB12: 150.0000 mg | ORAL_TABLET | Freq: Two times a day (BID) | ORAL | 2 refills | Status: DC

## 2020-11-16 MED ORDER — PREGABALIN 150 MG PO CAPS: 150.0000 mg | ORAL_CAPSULE | Freq: Two times a day (BID) | ORAL | 5 refills | Status: DC

## 2020-11-16 MED ORDER — TRAMADOL HCL 50 MG PO TABS: 50.0000 mg | ORAL_TABLET | Freq: Three times a day (TID) | ORAL | 0 refills | Status: DC | PRN

## 2020-11-16 MED ORDER — DULOXETINE HCL 30 MG PO CPEP
ORAL_CAPSULE | ORAL | 1 refills | Status: DC
Start: 2020-11-16 — End: 2021-07-30

## 2020-11-16 NOTE — Progress Notes (Signed)
Haven't heard any thing

## 2020-11-16 NOTE — Progress Notes (Addendum)
Established Patient Office Visit  Subjective:  Patient ID: Thomas Jennings, male    DOB: [birth year]  Age: 40 y.o. MRN: [id number]    HPI [name] presents for follow up for HTN, anxiety, sleep disturbance, and pain management.    He states that he is still having difficulties with sleep disturbance, which presents as a pounding heartbeat that keeps him awake when his mind races due to thoughts of life stressors.  He has tried to use Restoril with limited success and make lifestyle changes such as darkening his room, sleeping with earplugs, and using background noise.  His last difficult night with sleep was last night (night of 1/13).    He is doing a bit better with feeling run down at work, but still experiences days where he does "not feeling like himself".  He sometimes feels as though his sleep medications make him feel groggy the next day.    His chronic pain related to myelopathy and radiculopathy is still present but well controlled on 50 MG tramadol.    Past Medical History:  Diagnosis Date  . Cervical spondylosis with myelopathy and radiculopathy 03/09/2017  . GAD (generalized anxiety disorder) 03/09/2017  . Primary insomnia 03/09/2017  . SOB (shortness of breath) 03/09/2017    History reviewed. No pertinent surgical history.  Family History  Problem Relation Age of Onset  . Dementia Father   . Alcohol abuse Father   . Mental illness Mother     Social History   Socioeconomic History  . Marital status: Divorced    Spouse name: Not on file  . Number of children: Not on file  . Years of education: Not on file  . Highest education level: Not on file  Occupational History  . Not on file  Tobacco Use  . Smoking status: Former Research scientist (life sciences)  . Smokeless tobacco: Never Used  Vaping Use  . Vaping Use: Never used  Substance and Sexual Activity  . Alcohol use: Yes  . Drug use: No  . Sexual activity: Not Currently  Other Topics Concern  . Not on file  Social History  Narrative  . Not on file   Social Determinants of Health   Financial Resource Strain: Not on file  Food Insecurity: Not on file  Transportation Needs: Not on file  Physical Activity: Not on file  Stress: Not on file  Social Connections: Not on file  Intimate Partner Violence: Not on file    Outpatient Medications Prior to Visit  Medication Sig Dispense Refill  . amLODipine (NORVASC) 5 MG tablet Take 1 tablet (5 mg total) by mouth daily. 90 tablet 3  . busPIRone (BUSPAR) 15 MG tablet TAKE 1 TABLET BY MOUTH 2 TIMES DAILY. 180 tablet 0  . diclofenac sodium (VOLTAREN) 1 % GEL Apply 4 g topically 4 (four) times daily. To affected joint. 100 g 11  . meloxicam (MOBIC) 15 MG tablet Take 1 tablet (15 mg total) by mouth daily. 90 tablet 1  . omeprazole (PRILOSEC) 40 MG capsule TAKE 1 CAPSULE BY MOUTH EVERY DAY 90 capsule 3  . sildenafil (VIAGRA) 100 MG tablet TAKE 1 TABLET EVERY DAY (PRIOR TO SEXUAL ACTIVITY) AS NEEDED FOR ERECTILE DYSFUNCTION 6 tablet 3  . temazepam (RESTORIL) 7.5 MG capsule Take 1 capsule (7.5 mg total) by mouth at bedtime as needed for sleep. 90 capsule 0  . buPROPion (WELLBUTRIN SR) 100 MG 12 hr tablet TAKE 1 TABLET BY MOUTH TWICE A DAY 180 tablet 0  . DULoxetine (CYMBALTA) 30  MG capsule TAKE 1 CAPSULE BY MOUTH EVERY DAY 90 capsule 1  . losartan-hydrochlorothiazide (HYZAAR) 100-25 MG tablet Take 1 tablet by mouth daily. 90 tablet 3  . pregabalin (LYRICA) 150 MG capsule Take 1 capsule (150 mg total) by mouth 2 (two) times daily. 60 capsule 5  . traMADol (ULTRAM) 50 MG tablet Take 1 tablet (50 mg total) by mouth every 8 (eight) hours as needed for severe pain. 90 tablet 0  . traMADol (ULTRAM) 50 MG tablet Take one tablet three times a day for pain. 90 tablet 0  . traMADol (ULTRAM) 50 MG tablet Take one tablet three times a day for pain. 90 tablet 0   No facility-administered medications prior to visit.    Allergies  Allergen Reactions  . Gabapentin     dizzy  .  Lisinopril     Ace cough    ROS Review of Systems  Constitutional: Negative for activity change, chills, fatigue and fever.  HENT: Negative for congestion, postnasal drip, rhinorrhea and sore throat.   Eyes: Negative for redness and itching.  Respiratory: Negative for cough, chest tightness and shortness of breath.   Cardiovascular: Negative for chest pain and palpitations.       Pounding heart beat with stressful thoughts  Gastrointestinal: Negative for abdominal pain, constipation, diarrhea, nausea and vomiting.  Endocrine: Negative for polydipsia and polyuria.  Genitourinary: Negative for dysuria, flank pain, frequency and urgency.  Musculoskeletal: Positive for myalgias and neck pain.  Skin: Negative for color change and rash.  Neurological: Negative for dizziness and speech difficulty.  Hematological: Negative for adenopathy. Does not bruise/bleed easily.  Psychiatric/Behavioral: Positive for decreased concentration and sleep disturbance. Negative for hallucinations, self-injury and suicidal ideas. The patient is nervous/anxious.       Objective:    Physical Exam  BP (!) 139/91   Pulse 87   Wt 224 lb 0.6 oz (101.6 kg)   SpO2 98%   BMI 29.56 kg/m  Wt Readings from Last 3 Encounters:  11/16/20 224 lb 0.6 oz (101.6 kg)  10/15/20 224 lb (101.6 kg)  09/17/20 227 lb (103 kg)     Health Maintenance Due  Topic Date Due  . Hepatitis C Screening  Never done  . HIV Screening  Never done    There are no preventive care reminders to display for this patient.  Lab Results  Component Value Date   TSH 1.22 10/15/2020   Lab Results  Component Value Date   WBC 4.6 10/15/2020   HGB 16.1 10/15/2020   HCT 45.2 10/15/2020   MCV 85.8 10/15/2020   PLT 244 10/15/2020   Lab Results  Component Value Date   NA 141 10/15/2020   K 4.2 10/15/2020   CO2 28 10/15/2020   GLUCOSE 85 10/15/2020   BUN 11 10/15/2020   CREATININE 0.77 10/15/2020   BILITOT 0.6 10/15/2020   AST 40  10/15/2020   ALT 82 (H) 10/15/2020   PROT 7.3 10/15/2020   CALCIUM 9.6 10/15/2020   Lab Results  Component Value Date   CHOL 231 (H) 10/15/2020   Lab Results  Component Value Date   HDL 75 10/15/2020   Lab Results  Component Value Date   LDLCALC 125 (H) 10/15/2020   Lab Results  Component Value Date   TRIG 195 (H) 10/15/2020   Lab Results  Component Value Date   CHOLHDL 3.1 10/15/2020      Assessment & Plan:  [name] KitchenMarland KitchenDiagnoses and all orders for this visit:  Poor concentration -  buPROPion (WELLBUTRIN SR) 150 MG 12 hr tablet; Take 1 tablet (150 mg total) by mouth 2 (two) times daily.  Cervical spinal stenosis -     traMADol (ULTRAM) 50 MG tablet; Take one tablet three times a day for pain. -     traMADol (ULTRAM) 50 MG tablet; Take 1 tablet (50 mg total) by mouth every 8 (eight) hours as needed for severe pain. -     traMADol (ULTRAM) 50 MG tablet; Take one tablet three times a day for pain. -     pregabalin (LYRICA) 150 MG capsule; Take 1 capsule (150 mg total) by mouth 2 (two) times daily. -     DULoxetine (CYMBALTA) 30 MG capsule; TAKE 1 CAPSULE BY MOUTH EVERY DAY  Chronic pain syndrome -     traMADol (ULTRAM) 50 MG tablet; Take one tablet three times a day for pain. -     traMADol (ULTRAM) 50 MG tablet; Take 1 tablet (50 mg total) by mouth every 8 (eight) hours as needed for severe pain. -     traMADol (ULTRAM) 50 MG tablet; Take one tablet three times a day for pain. -     pregabalin (LYRICA) 150 MG capsule; Take 1 capsule (150 mg total) by mouth 2 (two) times daily. -     DULoxetine (CYMBALTA) 30 MG capsule; TAKE 1 CAPSULE BY MOUTH EVERY DAY  Carpal tunnel syndrome, bilateral -     traMADol (ULTRAM) 50 MG tablet; Take one tablet three times a day for pain. -     traMADol (ULTRAM) 50 MG tablet; Take 1 tablet (50 mg total) by mouth every 8 (eight) hours as needed for severe pain. -     traMADol (ULTRAM) 50 MG tablet; Take one tablet three times a day for pain. -      pregabalin (LYRICA) 150 MG capsule; Take 1 capsule (150 mg total) by mouth 2 (two) times daily.  Moderate episode of recurrent major depressive disorder (HCC) -     buPROPion (WELLBUTRIN SR) 150 MG 12 hr tablet; Take 1 tablet (150 mg total) by mouth 2 (two) times daily. -     DULoxetine (CYMBALTA) 30 MG capsule; TAKE 1 CAPSULE BY MOUTH EVERY DAY  GAD (generalized anxiety disorder) -     buPROPion (WELLBUTRIN SR) 150 MG 12 hr tablet; Take 1 tablet (150 mg total) by mouth 2 (two) times daily. -     DULoxetine (CYMBALTA) 30 MG capsule; TAKE 1 CAPSULE BY MOUTH EVERY DAY   Pain contract UTD.  [name].PDMP reviewed during this encounter. Tramadol refilled.  Continue cymbalta/lyrica.   Increased wellbutrin for mood and poor concentration. He has not been called about scheduling for ADHD screening. Will send note to referral coordinator.   Discussed sleep. Has restoril. Failed trazadone/seroquel/remeron. Discussed routine, avoiding caffeine and screens before bed. Discussed due to work schedule he will have a different sleep schedule.    Follow-up: Return in about 3 months (around 02/14/2021).    [name] KitchenVernetta Honey PA-C, have reviewed and agree with the above documentation in it's entirety.

## 2020-12-03 ENCOUNTER — Ambulatory Visit (INDEPENDENT_AMBULATORY_CARE_PROVIDER_SITE_OTHER): Payer: 59 | Admitting: Nurse Practitioner

## 2020-12-03 ENCOUNTER — Other Ambulatory Visit: Payer: Self-pay

## 2020-12-03 ENCOUNTER — Encounter: Payer: Self-pay | Admitting: Nurse Practitioner

## 2020-12-03 VITALS — BP 156/93 | HR 71 | Temp 97.9°F | Ht 73.0 in | Wt 221.5 lb

## 2020-12-03 DIAGNOSIS — H109 Unspecified conjunctivitis: Secondary | ICD-10-CM | POA: Diagnosis not present

## 2020-12-03 MED ORDER — OFLOXACIN 0.3 % OP SOLN
1.0000 [drp] | Freq: Four times a day (QID) | OPHTHALMIC | 0 refills | Status: DC
Start: 2020-12-03 — End: 2021-03-18

## 2020-12-03 NOTE — Progress Notes (Signed)
Acute Office Visit  Subjective:    Patient ID: Thomas Jennings, male    DOB: January 24, 1981, 40 y.o.   MRN: [id number]  No chief complaint on file.   HPI Patient is in today for eye irritation and drainage that started yesterday. He first noticed that the eye was tender and when he woke this morning his eye was matted shut. 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   EYE PAIN Duration:  days Involved eye:  right Onset: sudden Severity: moderate  Quality: aching, burning, itchy, sore, tender and tearing Foreign body sensation:no Visual impairment: no Eye redness: yes Discharge: yes Crusting or matting of eyelids: yes Swelling: yes Photophobia: no Itching: yes Tearing: yes Headache: no Floaters: no URI symptoms: no Contact lens use: no Close contacts with similar problems: no Eye trauma: no Aggravating factors:  Alleviating factors:  Status: worse Treatments attempted: none  Past Medical History:  Diagnosis Date  . Cervical spondylosis with myelopathy and radiculopathy 03/09/2017  . GAD (generalized anxiety disorder) 03/09/2017  . Primary insomnia 03/09/2017  . SOB (shortness of breath) 03/09/2017    No past surgical history on file.  Family History  Problem Relation Age of Onset  . Dementia Father   . Alcohol abuse Father   . Mental illness Mother     Social History   Socioeconomic History  . Marital status: Divorced    Spouse name: Not on file  . Number of children: Not on file  . Years of education: Not on file  . Highest education level: Not on file  Occupational History  . Not on file  Tobacco Use  . Smoking status: Former Research scientist (life sciences)  . Smokeless tobacco: Never Used  Vaping Use  . Vaping Use: Never used  Substance and Sexual Activity  . Alcohol use: Yes  . Drug use: No  . Sexual activity: Not Currently  Other Topics Concern  . Not on file  Social History Narrative  . Not on file   Social Determinants of Health   Financial Resource  Strain: Not on file  Food Insecurity: Not on file  Transportation Needs: Not on file  Physical Activity: Not on file  Stress: Not on file  Social Connections: Not on file  Intimate Partner Violence: Not on file    Outpatient Medications Prior to Visit  Medication Sig Dispense Refill  . amLODipine (NORVASC) 5 MG tablet Take 1 tablet (5 mg total) by mouth daily. 90 tablet 3  . buPROPion (WELLBUTRIN SR) 150 MG 12 hr tablet Take 1 tablet (150 mg total) by mouth 2 (two) times daily. 60 tablet 2  . busPIRone (BUSPAR) 15 MG tablet TAKE 1 TABLET BY MOUTH 2 TIMES DAILY. 180 tablet 0  . diclofenac sodium (VOLTAREN) 1 % GEL Apply 4 g topically 4 (four) times daily. To affected joint. 100 g 11  . DULoxetine (CYMBALTA) 30 MG capsule TAKE 1 CAPSULE BY MOUTH EVERY DAY 90 capsule 1  . meloxicam (MOBIC) 15 MG tablet Take 1 tablet (15 mg total) by mouth daily. 90 tablet 1  . omeprazole (PRILOSEC) 40 MG capsule TAKE 1 CAPSULE BY MOUTH EVERY DAY 90 capsule 3  . pregabalin (LYRICA) 150 MG capsule Take 1 capsule (150 mg total) by mouth 2 (two) times daily. 60 capsule 5  . sildenafil (VIAGRA) 100 MG tablet TAKE 1 TABLET EVERY DAY (PRIOR TO SEXUAL ACTIVITY) AS NEEDED FOR ERECTILE DYSFUNCTION 6 tablet 3  . temazepam (RESTORIL) 7.5 MG capsule Take 1 capsule (  7.5 mg total) by mouth at bedtime as needed for sleep. 90 capsule 0  . traMADol (ULTRAM) 50 MG tablet Take one tablet three times a day for pain. 90 tablet 0  . traMADol (ULTRAM) 50 MG tablet Take 1 tablet (50 mg total) by mouth every 8 (eight) hours as needed for severe pain. 90 tablet 0  . traMADol (ULTRAM) 50 MG tablet Take one tablet three times a day for pain. 90 tablet 0   No facility-administered medications prior to visit.    Allergies  Allergen Reactions  . Gabapentin     dizzy  . Lisinopril     Ace cough    Review of Systems All review of systems negative except what is listed in the HPI     Objective:    Physical Exam Vitals and  nursing note reviewed.  Constitutional:      Appearance: Normal appearance.  HENT:     Head: Normocephalic.  Eyes:     General: Vision grossly intact. Gaze aligned appropriately.        Right eye: Discharge present. No foreign body or hordeolum.        Left eye: No foreign body, discharge or hordeolum.     Pupils: Pupils are equal, round, and reactive to light.     Comments: Mild edema and erythema noted to the right eyelid and conjunctiva. There is dried discharge present on the lashes of the right eye.   Cardiovascular:     Rate and Rhythm: Normal rate and regular rhythm.  Pulmonary:     Effort: Pulmonary effort is normal.     Breath sounds: Normal breath sounds.  Musculoskeletal:     Cervical back: Normal range of motion.  Lymphadenopathy:     Cervical: No cervical adenopathy.  Skin:    General: Skin is warm and dry.     Capillary Refill: Capillary refill takes less than 2 seconds.  Neurological:     General: No focal deficit present.     Mental Status: He is alert and oriented to person, place, and time.  Psychiatric:        Mood and Affect: Mood normal.        Behavior: Behavior normal.        Thought Content: Thought content normal.        Judgment: Judgment normal.     There were no vitals taken for this visit. Wt Readings from Last 3 Encounters:  11/16/20 224 lb 0.6 oz (101.6 kg)  10/15/20 224 lb (101.6 kg)  09/17/20 227 lb (103 kg)    Health Maintenance Due  Topic Date Due  . Hepatitis C Screening  Never done  . HIV Screening  Never done    There are no preventive care reminders to display for this patient.   Lab Results  Component Value Date   TSH 1.22 10/15/2020   Lab Results  Component Value Date   WBC 4.6 10/15/2020   HGB 16.1 10/15/2020   HCT 45.2 10/15/2020   MCV 85.8 10/15/2020   PLT 244 10/15/2020   Lab Results  Component Value Date   NA 141 10/15/2020   K 4.2 10/15/2020   CO2 28 10/15/2020   GLUCOSE 85 10/15/2020   BUN 11  10/15/2020   CREATININE 0.77 10/15/2020   BILITOT 0.6 10/15/2020   AST 40 10/15/2020   ALT 82 (H) 10/15/2020   PROT 7.3 10/15/2020   CALCIUM 9.6 10/15/2020   Lab Results  Component Value Date  CHOL 231 (H) 10/15/2020   Lab Results  Component Value Date   HDL 75 10/15/2020   Lab Results  Component Value Date   LDLCALC 125 (H) 10/15/2020   Lab Results  Component Value Date   TRIG 195 (H) 10/15/2020   Lab Results  Component Value Date   CHOLHDL 3.1 10/15/2020   No results found for: HGBA1C     Assessment & Plan:   1. Bacterial conjunctivitis of right eye Symptoms and presentation consistent with bacterial conjunctivitis.  Recommend warm compresses 4 times a day, wash with tear free baby shampoo twice a day. Prescription for ofloxacin provided for 7-10 days in affected eye.  Follow-up if symptoms worsen or fail to improve.  - ofloxacin (OCUFLOX) 0.3 % ophthalmic solution; Place 1 drop into the right eye 4 (four) times daily.  Dispense: 5 mL; Refill: 0  Return if symptoms worsen or fail to improve.  Orma Render, NP

## 2020-12-03 NOTE — Patient Instructions (Signed)

## 2020-12-09 ENCOUNTER — Other Ambulatory Visit: Payer: Self-pay | Admitting: Physician Assistant

## 2020-12-09 DIAGNOSIS — I10 Essential (primary) hypertension: Secondary | ICD-10-CM

## 2020-12-17 ENCOUNTER — Encounter: Payer: Self-pay | Admitting: Physician Assistant

## 2020-12-17 ENCOUNTER — Other Ambulatory Visit: Payer: Self-pay

## 2020-12-17 ENCOUNTER — Ambulatory Visit (INDEPENDENT_AMBULATORY_CARE_PROVIDER_SITE_OTHER): Payer: 59 | Admitting: Physician Assistant

## 2020-12-17 DIAGNOSIS — K2100 Gastro-esophageal reflux disease with esophagitis, without bleeding: Secondary | ICD-10-CM | POA: Diagnosis not present

## 2020-12-17 DIAGNOSIS — M4802 Spinal stenosis, cervical region: Secondary | ICD-10-CM

## 2020-12-17 DIAGNOSIS — G5603 Carpal tunnel syndrome, bilateral upper limbs: Secondary | ICD-10-CM | POA: Diagnosis not present

## 2020-12-17 DIAGNOSIS — G894 Chronic pain syndrome: Secondary | ICD-10-CM | POA: Diagnosis not present

## 2020-12-17 MED ORDER — TRAMADOL HCL 50 MG PO TABS: ORAL_TABLET | ORAL | 0 refills | Status: DC

## 2020-12-17 MED ORDER — OMEPRAZOLE 40 MG PO CPDR: 40.0000 mg | DELAYED_RELEASE_CAPSULE | Freq: Every day | ORAL | 3 refills | Status: DC

## 2020-12-17 MED ORDER — TRAMADOL HCL 50 MG PO TABS
50.0000 mg | ORAL_TABLET | Freq: Three times a day (TID) | ORAL | 0 refills | Status: DC | PRN
Start: 2021-01-13 — End: 2021-03-15

## 2020-12-17 NOTE — Progress Notes (Signed)
Subjective:    Patient ID: Thomas Jennings, male    DOB: 09-15-1981, 40 y.o.   MRN: [id number]  HPI  Patient is a 40 year old male with hypertension, GERD, cervical spondylosis with chronic pain, MDD, anxiety who presents to the clinic for follow-up.  Patient is actually not due for a visit.  This was a previously scheduled visit but we happen to have acute visits but cover this.  He is doing well.  His mood is controlled.  His pain is well controlled.  His focus at work seems to be better.  He has no significant concerns or issues.  He denies any suicidal thoughts or homicidal idealizations.  He continues to have intermittent problems sleeping.  Restoril does help when he takes it.  Reflux controlled on omeprazole.   .. Active Ambulatory Problems    Diagnosis Date Noted  . Cervical spinal stenosis 03/09/2017  . Cervical spondylosis without myelopathy 02/22/2013  . Elevated blood pressure reading 03/09/2017  . SOB (shortness of breath) 03/09/2017  . GAD (generalized anxiety disorder) 03/09/2017  . Insomnia 03/09/2017  . Essential hypertension 05/11/2017  . History of diverticulitis 08/05/2017  . Atypical chest pain 08/05/2017  . Gastroesophageal reflux disease with esophagitis 08/05/2017  . Moderate episode of recurrent major depressive disorder ([place]) 08/05/2017  . Carpal tunnel syndrome, bilateral 02/02/2018  . Drug-induced erectile dysfunction 04/23/2018  . Chronic pain syndrome 07/16/2018  . Lipoma of torso 10/21/2018  . Blood in stool 10/21/2018  . Pancreatic mass 06/29/2019  . Lower abdominal pain 06/29/2019  . Left lateral epicondylitis 08/09/2019  . Elevated ALT measurement 10/16/2020   Resolved Ambulatory Problems    Diagnosis Date Noted  . No Resolved Ambulatory Problems   Past Medical History:  Diagnosis Date  . Cervical spondylosis with myelopathy and radiculopathy 03/09/2017  . Primary insomnia 03/09/2017         Review of Systems See HPI.     Objective:    Physical Exam Vitals reviewed.  Constitutional:      Appearance: Normal appearance.  Cardiovascular:     Rate and Rhythm: Normal rate and regular rhythm.     Pulses: Normal pulses.     Heart sounds: Normal heart sounds.  Neurological:     General: No focal deficit present.     Mental Status: He is alert and oriented to person, place, and time.  Psychiatric:        Mood and Affect: Mood normal.      . Depression screen [facility] 2/9 12/17/2020 09/17/2020 02/23/2020 06/29/2019 04/21/2019  Decreased Interest 0 0 1 1 1   Down, Depressed, Hopeless 1 1 1 1 1   PHQ - 2 Score 1 1 2 2 2   Altered sleeping 1 3 2 3 3   Tired, decreased energy 1 1 2 3 3   Change in appetite 0 1 1 0 1  Feeling bad or failure about yourself  0 0 1 0 0  Trouble concentrating 3 3 2 3 3   Moving slowly or fidgety/restless 1 0 0 0 1  Suicidal thoughts 0 0 0 0 0  PHQ-9 Score 7 9 10 11 13   Difficult doing work/chores Somewhat difficult Somewhat difficult Somewhat difficult Somewhat difficult Somewhat difficult  Some recent data might be hidden   .[name] GAD 7 : Generalized Anxiety Score 12/17/2020 09/17/2020 02/23/2020 06/29/2019  Nervous, Anxious, on Edge 1 3 2 3   Control/stop worrying 2 3 2 3   Worry too much - different things 3 3 2 3   Trouble relaxing 2  3 2 3   Restless 2 3 2 3   Easily annoyed or irritable 1 1 1 1   Afraid - awful might happen 0 0 0 0  Total GAD 7 Score 11 16 11 16   Anxiety Difficulty Somewhat difficult Very difficult Somewhat difficult Somewhat difficult         Assessment & Plan:  [name] KitchenMarland KitchenRhonda was seen today for pain.  Diagnoses and all orders for this visit:  Cervical spinal stenosis -     traMADol (ULTRAM) 50 MG tablet; Take one tablet three times a day for pain. -     traMADol (ULTRAM) 50 MG tablet; Take 1 tablet (50 mg total) by mouth every 8 (eight) hours as needed for severe pain. -     traMADol (ULTRAM) 50 MG tablet; Take one tablet three times a day for pain.  Chronic pain syndrome -      traMADol (ULTRAM) 50 MG tablet; Take one tablet three times a day for pain. -     traMADol (ULTRAM) 50 MG tablet; Take 1 tablet (50 mg total) by mouth every 8 (eight) hours as needed for severe pain. -     traMADol (ULTRAM) 50 MG tablet; Take one tablet three times a day for pain. -     DRUG MONITORING, PANEL 6 WITH CONFIRMATION, URINE  Carpal tunnel syndrome, bilateral -     traMADol (ULTRAM) 50 MG tablet; Take one tablet three times a day for pain. -     traMADol (ULTRAM) 50 MG tablet; Take 1 tablet (50 mg total) by mouth every 8 (eight) hours as needed for severe pain. -     traMADol (ULTRAM) 50 MG tablet; Take one tablet three times a day for pain.  Gastroesophageal reflux disease with esophagitis -     omeprazole (PRILOSEC) 40 MG capsule; Take 1 capsule (40 mg total) by mouth daily.  Other orders -     DM TEMPLATE   Pain contract signed and updated.  Pain is controlled per patient.  UDS ordered today.  [name].PDMP reviewed during this encounter.  No concerns.  Tramadol refilled.  Continue on mobic/lyrica/cymbalta. Follow up in 3 months.   BP to goal. No refills needed.   GERD-controlled. Sent omeprazole.   PHQ/GAD numbers still not to goal but patient is feeling better.

## 2020-12-19 ENCOUNTER — Encounter: Payer: Self-pay | Admitting: Physician Assistant

## 2020-12-19 LAB — DRUG MONITORING, PANEL 6 WITH CONFIRMATION, URINE
6 Acetylmorphine: NEGATIVE ng/mL (ref <10)
Alcohol Metabolites: POSITIVE ng/mL — AB
Amphetamines: NEGATIVE ng/mL (ref <500)
Barbiturates: NEGATIVE ng/mL (ref <300)
Benzodiazepines: NEGATIVE ng/mL (ref <100)
Cocaine Metabolite: NEGATIVE ng/mL (ref <150)
Creatinine: 19.1 mg/dL
Ethyl Glucuronide (ETG): 13016 ng/mL — ABNORMAL HIGH (ref <500)
Ethyl Sulfate (ETS): 2373 ng/mL — ABNORMAL HIGH (ref <100)
Marijuana Metabolite: NEGATIVE ng/mL (ref <20)
Methadone Metabolite: NEGATIVE ng/mL (ref <100)
Opiates: NEGATIVE ng/mL (ref <100)
Oxidant: NEGATIVE ug/mL
Oxycodone: NEGATIVE ng/mL (ref <100)
Phencyclidine: NEGATIVE ng/mL (ref <25)
Specific Gravity: 1.002 — ABNORMAL LOW (ref >=1.0)
pH: 7 (ref 4.5–9.0)

## 2020-12-19 LAB — DM TEMPLATE

## 2020-12-21 NOTE — Progress Notes (Signed)
Per patient he took cough syrup with alcohol the night before. Will recheck in 3 months. Discussed pain contract agreement via mychart.

## 2020-12-25 ENCOUNTER — Encounter: Payer: Self-pay | Admitting: Physician Assistant

## 2021-02-13 ENCOUNTER — Ambulatory Visit: Payer: 59 | Admitting: Physician Assistant

## 2021-03-15 ENCOUNTER — Telehealth: Payer: Self-pay

## 2021-03-15 ENCOUNTER — Other Ambulatory Visit: Payer: Self-pay

## 2021-03-15 DIAGNOSIS — G894 Chronic pain syndrome: Secondary | ICD-10-CM

## 2021-03-15 DIAGNOSIS — G5603 Carpal tunnel syndrome, bilateral upper limbs: Secondary | ICD-10-CM

## 2021-03-15 DIAGNOSIS — M4802 Spinal stenosis, cervical region: Secondary | ICD-10-CM

## 2021-03-15 MED ORDER — TRAMADOL HCL 50 MG PO TABS: 50.0000 mg | ORAL_TABLET | Freq: Three times a day (TID) | ORAL | 0 refills | Status: DC | PRN

## 2021-03-15 NOTE — Telephone Encounter (Signed)
Sent to [facility] in [place].

## 2021-03-15 NOTE — Telephone Encounter (Signed)
Jabaree needs [facility] Not the other pharmacy.

## 2021-03-15 NOTE — Telephone Encounter (Signed)
Pt called and asked for a refill on his tramadol, enough to last him until his appointment. Last fill was 02/12/21. Pt has upcoming appointment on 03/18/21.

## 2021-03-15 NOTE — Telephone Encounter (Signed)
Sent!

## 2021-03-18 ENCOUNTER — Other Ambulatory Visit: Payer: Self-pay

## 2021-03-18 ENCOUNTER — Ambulatory Visit (INDEPENDENT_AMBULATORY_CARE_PROVIDER_SITE_OTHER): Payer: 59 | Admitting: Physician Assistant

## 2021-03-18 ENCOUNTER — Encounter: Payer: Self-pay | Admitting: Physician Assistant

## 2021-03-18 VITALS — BP 165/95 | HR 86 | Ht 73.0 in | Wt 228.0 lb

## 2021-03-18 DIAGNOSIS — H9193 Unspecified hearing loss, bilateral: Secondary | ICD-10-CM | POA: Insufficient documentation

## 2021-03-18 DIAGNOSIS — F331 Major depressive disorder, recurrent, moderate: Secondary | ICD-10-CM

## 2021-03-18 DIAGNOSIS — M4802 Spinal stenosis, cervical region: Secondary | ICD-10-CM

## 2021-03-18 DIAGNOSIS — F411 Generalized anxiety disorder: Secondary | ICD-10-CM

## 2021-03-18 DIAGNOSIS — I10 Essential (primary) hypertension: Secondary | ICD-10-CM

## 2021-03-18 DIAGNOSIS — K2100 Gastro-esophageal reflux disease with esophagitis, without bleeding: Secondary | ICD-10-CM

## 2021-03-18 DIAGNOSIS — G894 Chronic pain syndrome: Secondary | ICD-10-CM

## 2021-03-18 DIAGNOSIS — G5603 Carpal tunnel syndrome, bilateral upper limbs: Secondary | ICD-10-CM

## 2021-03-18 DIAGNOSIS — R41840 Attention and concentration deficit: Secondary | ICD-10-CM | POA: Insufficient documentation

## 2021-03-18 DIAGNOSIS — G4700 Insomnia, unspecified: Secondary | ICD-10-CM

## 2021-03-18 MED ORDER — TEMAZEPAM 7.5 MG PO CAPS: 7.5000 mg | ORAL_CAPSULE | Freq: Every evening | ORAL | 1 refills | Status: DC | PRN

## 2021-03-18 MED ORDER — PREGABALIN 150 MG PO CAPS
150.0000 mg | ORAL_CAPSULE | Freq: Two times a day (BID) | ORAL | 5 refills | Status: DC
Start: 2021-03-18 — End: 2021-07-30

## 2021-03-18 MED ORDER — AMLODIPINE BESYLATE 5 MG PO TABS: 5.0000 mg | ORAL_TABLET | Freq: Every day | ORAL | 1 refills | Status: DC

## 2021-03-18 MED ORDER — OMEPRAZOLE 40 MG PO CPDR: 40.0000 mg | DELAYED_RELEASE_CAPSULE | Freq: Every day | ORAL | 3 refills | Status: DC

## 2021-03-18 MED ORDER — TRAMADOL HCL 50 MG PO TABS: ORAL_TABLET | ORAL | 0 refills | Status: DC

## 2021-03-18 MED ORDER — TRAMADOL HCL 50 MG PO TABS: 50.0000 mg | ORAL_TABLET | Freq: Three times a day (TID) | ORAL | 0 refills | Status: DC | PRN

## 2021-03-18 MED ORDER — BUPROPION HCL ER (SR) 150 MG PO TB12: 150.0000 mg | ORAL_TABLET | Freq: Two times a day (BID) | ORAL | 1 refills | Status: DC
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
## 2021-03-19 ENCOUNTER — Encounter: Payer: Self-pay | Admitting: Physician Assistant

## 2021-04-12 ENCOUNTER — Other Ambulatory Visit: Payer: Self-pay | Admitting: Physician Assistant

## 2021-04-12 DIAGNOSIS — N522 Drug-induced erectile dysfunction: Secondary | ICD-10-CM

## 2021-04-12 NOTE — Telephone Encounter (Signed)
Last written 03/26/2020 #6 with 3 refills

## 2021-06-26 ENCOUNTER — Other Ambulatory Visit: Payer: Self-pay | Admitting: Physician Assistant

## 2021-06-26 DIAGNOSIS — K2100 Gastro-esophageal reflux disease with esophagitis, without bleeding: Secondary | ICD-10-CM

## 2021-07-03 ENCOUNTER — Telehealth: Payer: Self-pay

## 2021-07-03 DIAGNOSIS — G894 Chronic pain syndrome: Secondary | ICD-10-CM

## 2021-07-03 DIAGNOSIS — M4802 Spinal stenosis, cervical region: Secondary | ICD-10-CM

## 2021-07-03 DIAGNOSIS — G5603 Carpal tunnel syndrome, bilateral upper limbs: Secondary | ICD-10-CM

## 2021-07-03 MED ORDER — TRAMADOL HCL 50 MG PO TABS: 50.0000 mg | ORAL_TABLET | Freq: Three times a day (TID) | ORAL | 0 refills | Status: DC | PRN

## 2021-07-03 NOTE — Telephone Encounter (Signed)
..  PDMP reviewed during this encounter. Filled last 8/2 ok for refill 9/2. Sent to pick up 9/2.

## 2021-07-03 NOTE — Telephone Encounter (Signed)
Pt informed of RX.  T. Retta Pitcher, CMA 
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
## 2021-07-19 ENCOUNTER — Ambulatory Visit: Payer: 59 | Admitting: Physician Assistant

## 2021-07-26 ENCOUNTER — Ambulatory Visit (INDEPENDENT_AMBULATORY_CARE_PROVIDER_SITE_OTHER): Payer: Self-pay | Admitting: Physician Assistant

## 2021-07-26 DIAGNOSIS — Z5329 Procedure and treatment not carried out because of patient's decision for other reasons: Secondary | ICD-10-CM

## 2021-07-26 DIAGNOSIS — Z0283 Encounter for blood-alcohol and blood-drug test: Secondary | ICD-10-CM

## 2021-07-26 NOTE — Progress Notes (Signed)
No show

## 2021-07-30 ENCOUNTER — Ambulatory Visit (INDEPENDENT_AMBULATORY_CARE_PROVIDER_SITE_OTHER): Payer: 59 | Admitting: Physician Assistant

## 2021-07-30 ENCOUNTER — Encounter: Payer: Self-pay | Admitting: Physician Assistant

## 2021-07-30 ENCOUNTER — Other Ambulatory Visit: Payer: Self-pay

## 2021-07-30 VITALS — BP 128/72 | HR 85 | Ht 73.0 in | Wt 219.0 lb

## 2021-07-30 DIAGNOSIS — M4802 Spinal stenosis, cervical region: Secondary | ICD-10-CM | POA: Diagnosis not present

## 2021-07-30 DIAGNOSIS — I10 Essential (primary) hypertension: Secondary | ICD-10-CM

## 2021-07-30 DIAGNOSIS — F331 Major depressive disorder, recurrent, moderate: Secondary | ICD-10-CM

## 2021-07-30 DIAGNOSIS — G894 Chronic pain syndrome: Secondary | ICD-10-CM

## 2021-07-30 DIAGNOSIS — F411 Generalized anxiety disorder: Secondary | ICD-10-CM

## 2021-07-30 DIAGNOSIS — G5603 Carpal tunnel syndrome, bilateral upper limbs: Secondary | ICD-10-CM

## 2021-07-30 DIAGNOSIS — D171 Benign lipomatous neoplasm of skin and subcutaneous tissue of trunk: Secondary | ICD-10-CM

## 2021-07-30 DIAGNOSIS — F5105 Insomnia due to other mental disorder: Secondary | ICD-10-CM

## 2021-07-30 MED ORDER — AMLODIPINE BESYLATE 5 MG PO TABS: 5.0000 mg | ORAL_TABLET | Freq: Every day | ORAL | 1 refills | Status: DC

## 2021-07-30 MED ORDER — DULOXETINE HCL 30 MG PO CPEP: ORAL_CAPSULE | ORAL | 1 refills | Status: DC

## 2021-07-30 MED ORDER — MELOXICAM 15 MG PO TABS: 15.0000 mg | ORAL_TABLET | Freq: Every day | ORAL | 1 refills | Status: DC

## 2021-07-30 MED ORDER — BELSOMRA 10 MG PO TABS: 1.0000 | ORAL_TABLET | Freq: Every day | ORAL | 0 refills | Status: DC

## 2021-07-30 MED ORDER — TRAMADOL HCL 50 MG PO TABS: 50.0000 mg | ORAL_TABLET | Freq: Three times a day (TID) | ORAL | 0 refills | Status: DC | PRN

## 2021-07-30 MED ORDER — PREGABALIN 150 MG PO CAPS: 150.0000 mg | ORAL_CAPSULE | Freq: Two times a day (BID) | ORAL | 5 refills | Status: DC

## 2021-07-30 MED ORDER — TRAMADOL HCL 50 MG PO TABS: ORAL_TABLET | ORAL | 0 refills | Status: DC

## 2021-07-30 NOTE — Progress Notes (Signed)
Subjective:    Patient ID: Thomas Jennings, male    DOB: 20-Dec-1980, 40 y.o.   MRN: [id number]  HPI Pt is a 40 yo male with HTN, GERD, cervical spondylosis with chronic pain, MDD, GAD and insomnia who presents to the clinic for medication refills.   Pt is taking medication and doing overall well. He is much happier. He has plugged into a church and has more of a support network. He is able to see his daughter more and more. He is taking his medication. Denies any CP, palpitations, headaches or vision changes.   He continues to have chronic neck pain. Medication does help. He decided to try chiropractor. He does not feel like it is helping. Neurosurgery wanted to do surgery. Pt still states he " does not have time to be out of work".   He continues to have trouble sleeping. Restoril helps but some nights has to take 2 to even help. He then still wakes up.   He does not like the mass just behind shoulder. He wonders if he could get removed.   .. Active Ambulatory Problems    Diagnosis Date Noted   Cervical spinal stenosis 03/09/2017   Cervical spondylosis without myelopathy 02/22/2013   Elevated blood pressure reading 03/09/2017   SOB (shortness of breath) 03/09/2017   GAD (generalized anxiety disorder) 03/09/2017   Insomnia 03/09/2017   Essential hypertension 05/11/2017   History of diverticulitis 08/05/2017   Atypical chest pain 08/05/2017   Gastroesophageal reflux disease with esophagitis 08/05/2017   Moderate episode of recurrent major depressive disorder ([place]) 08/05/2017   Carpal tunnel syndrome, bilateral 02/02/2018   Drug-induced erectile dysfunction 04/23/2018   Chronic pain syndrome 07/16/2018   Lipoma of torso 10/21/2018   Blood in stool 10/21/2018   Pancreatic mass 06/29/2019   Lower abdominal pain 06/29/2019   Left lateral epicondylitis 08/09/2019   Elevated ALT measurement 10/16/2020   Poor concentration 03/18/2021   Bilateral hearing loss 03/18/2021   Resolved  Ambulatory Problems    Diagnosis Date Noted   No Resolved Ambulatory Problems   Past Medical History:  Diagnosis Date   Cervical spondylosis with myelopathy and radiculopathy 03/09/2017   Primary insomnia 03/09/2017     Review of Systems See HPI.     Objective:   Physical Exam Skin:    Comments: Firm mobile non tender subcutaneous mass just under left shoulder blade nearing axilla approximately size of golf ball.       .. Depression screen [facility] 2/9 07/30/2021 12/17/2020 09/17/2020 02/23/2020 06/29/2019  Decreased Interest 0 0 0 1 1  Down, Depressed, Hopeless 0 1 1 1 1   PHQ - 2 Score 0 1 1 2 2   Altered sleeping - 1 3 2 3   Tired, decreased energy - 1 1 2 3   Change in appetite - 0 1 1 0  Feeling bad or failure about yourself  - 0 0 1 0  Trouble concentrating - 3 3 2 3   Moving slowly or fidgety/restless - 1 0 0 0  Suicidal thoughts - 0 0 0 0  PHQ-9 Score - 7 9 10 11   Difficult doing work/chores - Somewhat difficult Somewhat difficult Somewhat difficult Somewhat difficult  Some recent data might be hidden   .[name] GAD 7 : Generalized Anxiety Score 12/17/2020 09/17/2020 02/23/2020 06/29/2019  Nervous, Anxious, on Edge 1 3 2 3   Control/stop worrying 2 3 2 3   Worry too much - different things 3 3 2 3   Trouble relaxing 2 3 2  3  Restless 2 3 2 3   Easily annoyed or irritable 1 1 1 1   Afraid - awful might happen 0 0 0 0  Total GAD 7 Score 11 16 11 16   Anxiety Difficulty Somewhat difficult Very difficult Somewhat difficult Somewhat difficult        Assessment & Plan:  [name] KitchenMarland KitchenKimo was seen today for follow-up.  Diagnoses and all orders for this visit:  Chronic pain syndrome -     traMADol (ULTRAM) 50 MG tablet; Take one tablet three times a day for pain. -     traMADol (ULTRAM) 50 MG tablet; Take one tablet three times a day for pain. -     traMADol (ULTRAM) 50 MG tablet; Take 1 tablet (50 mg total) by mouth every 8 (eight) hours as needed for severe pain. -     pregabalin (LYRICA) 150  MG capsule; Take 1 capsule (150 mg total) by mouth 2 (two) times daily. -     DULoxetine (CYMBALTA) 30 MG capsule; TAKE 1 CAPSULE BY MOUTH EVERY DAY  Cervical spinal stenosis -     traMADol (ULTRAM) 50 MG tablet; Take one tablet three times a day for pain. -     traMADol (ULTRAM) 50 MG tablet; Take one tablet three times a day for pain. -     traMADol (ULTRAM) 50 MG tablet; Take 1 tablet (50 mg total) by mouth every 8 (eight) hours as needed for severe pain. -     pregabalin (LYRICA) 150 MG capsule; Take 1 capsule (150 mg total) by mouth 2 (two) times daily. -     DULoxetine (CYMBALTA) 30 MG capsule; TAKE 1 CAPSULE BY MOUTH EVERY DAY -     meloxicam (MOBIC) 15 MG tablet; Take 1 tablet (15 mg total) by mouth daily.  Carpal tunnel syndrome, bilateral -     traMADol (ULTRAM) 50 MG tablet; Take one tablet three times a day for pain. -     traMADol (ULTRAM) 50 MG tablet; Take one tablet three times a day for pain. -     traMADol (ULTRAM) 50 MG tablet; Take 1 tablet (50 mg total) by mouth every 8 (eight) hours as needed for severe pain. -     pregabalin (LYRICA) 150 MG capsule; Take 1 capsule (150 mg total) by mouth 2 (two) times daily. -     meloxicam (MOBIC) 15 MG tablet; Take 1 tablet (15 mg total) by mouth daily.  Moderate episode of recurrent major depressive disorder (HCC) -     DULoxetine (CYMBALTA) 30 MG capsule; TAKE 1 CAPSULE BY MOUTH EVERY DAY  GAD (generalized anxiety disorder) -     DULoxetine (CYMBALTA) 30 MG capsule; TAKE 1 CAPSULE BY MOUTH EVERY DAY  Essential hypertension -     amLODipine (NORVASC) 5 MG tablet; Take 1 tablet (5 mg total) by mouth daily.  Lipoma of torso  Insomnia due to mental disorder -     Suvorexant (BELSOMRA) 10 MG TABS; Take 1 tablet by mouth at bedtime.   [name] KitchenMarland KitchenPDMP reviewed during this encounter. Refilled tramadol, cymbalta, lyrica, mobic.  BP looks great. Refilled norvasc.  Stop restoril and start belsomra give it at least 4 weeks.  Referral to Dr.  Darene Jennings for lipoma removal.

## 2021-07-30 NOTE — Patient Instructions (Addendum)
Dr. Darene Lamer for lipoma removal  Lipoma A lipoma is a noncancerous (benign) tumor that is made up of fat cells. This is a very common type of soft-tissue growth. Lipomas are usually found under the skin (subcutaneous). They may occur in any tissue of the body that contains fat. Common areas for lipomas to appear include the back, arms, shoulders, buttocks, and thighs. Lipomas grow slowly, and they are usually painless. Most lipomas do not cause problems and do not require treatment. What are the causes? The cause of this condition is not known. What increases the risk? You are more likely to develop this condition if: You are 54-60 years old. You have a family history of lipomas. What are the signs or symptoms? A lipoma usually appears as a small, round bump under the skin. In most cases, the lump will: Feel soft or rubbery. Not cause pain or other symptoms. However, if a lipoma is located in an area where it pushes on nerves, it can become painful or cause other symptoms. How is this diagnosed? A lipoma can usually be diagnosed with a physical exam. You may also have tests to confirm the diagnosis and to rule out other conditions. Tests may include: Imaging tests, such as a CT scan or an MRI. Removal of a tissue sample to be looked at under a microscope (biopsy). How is this treated? Treatment for this condition depends on the size of the lipoma and whether it is causing any symptoms. For small lipomas that are not causing problems, no treatment is needed. If a lipoma is bigger or it causes problems, surgery may be done to remove the lipoma. Lipomas can also be removed to improve appearance. Most often, the procedure is done after applying a medicine that numbs the area (local anesthetic). Liposuction may be done to reduce the size of the lipoma before it is removed through surgery, or it may be done to remove the lipoma. Lipomas are removed with this method in order to limit incision size and  scarring. A liposuction tube is inserted through a small incision into the lipoma, and the contents of the lipoma are removed through the tube with suction. Follow these instructions at home: Watch your lipoma for any changes. Keep all follow-up visits as told by your health care provider. This is important. Contact a health care provider if: Your lipoma becomes larger or hard. Your lipoma becomes painful, red, or increasingly swollen. These could be signs of infection or a more serious condition. Get help right away if: You develop tingling or numbness in an area near the lipoma. This could indicate that the lipoma is causing nerve damage. Summary A lipoma is a noncancerous tumor that is made up of fat cells. Most lipomas do not cause problems and do not require treatment. If a lipoma is bigger or it causes problems, surgery may be done to remove the lipoma. Contact a health care provider if your lipoma becomes larger or hard, or if it becomes painful, red, or increasingly swollen. Pain, redness, and swelling could be signs of infection or a more serious condition. This information is not intended to replace advice given to you by your health care provider. Make sure you discuss any questions you have with your health care provider. Document Revised: 06/06/2019 Document Reviewed: 06/06/2019 Elsevier Patient Education  [place].

## 2021-08-01 ENCOUNTER — Encounter: Payer: Self-pay | Admitting: Physician Assistant

## 2021-08-01 DIAGNOSIS — F5105 Insomnia due to other mental disorder: Secondary | ICD-10-CM | POA: Insufficient documentation

## 2021-08-16 ENCOUNTER — Ambulatory Visit: Payer: 59 | Admitting: Sports Medicine

## 2021-09-02 ENCOUNTER — Telehealth: Payer: Self-pay

## 2021-09-02 NOTE — Telephone Encounter (Signed)
Medication: Suvorexant (BELSOMRA) 10 MG TABS Prior authorization submitted via CoverMyMeds on 09/02/2021 PA submission pending

## 2021-09-02 NOTE — Telephone Encounter (Signed)
Medication: Suvorexant (BELSOMRA) 10 MG TABS Prior authorization determination received Medication has been approved Approval dates: 09/02/2021-09/02/2022  Patient aware via: [place] aware: Yes Provider aware via this encounter

## 2021-09-05 ENCOUNTER — Telehealth: Payer: Self-pay | Admitting: Neurology

## 2021-09-05 NOTE — Telephone Encounter (Signed)
Patient left vm asking for date/results of most recent MR.   Looks like had MR of spine in 2021 and of abdomen in 2020. Tried to call patient to discuss with no answer and vm not set up.   Mychart message sent to patient.

## 2021-09-23 ENCOUNTER — Other Ambulatory Visit: Payer: Self-pay | Admitting: Physician Assistant

## 2021-09-23 DIAGNOSIS — G5603 Carpal tunnel syndrome, bilateral upper limbs: Secondary | ICD-10-CM

## 2021-09-23 DIAGNOSIS — G894 Chronic pain syndrome: Secondary | ICD-10-CM

## 2021-09-23 DIAGNOSIS — M4802 Spinal stenosis, cervical region: Secondary | ICD-10-CM

## 2021-09-23 NOTE — Telephone Encounter (Signed)
Ry needs the [facility]adol [facility] [address].

## 2021-10-02 ENCOUNTER — Other Ambulatory Visit: Payer: Self-pay

## 2021-10-02 DIAGNOSIS — N522 Drug-induced erectile dysfunction: Secondary | ICD-10-CM

## 2021-10-02 MED ORDER — SILDENAFIL CITRATE 100 MG PO TABS: 100.0000 mg | ORAL_TABLET | ORAL | 5 refills | Status: DC | PRN

## 2021-10-21 ENCOUNTER — Telehealth: Payer: Self-pay

## 2021-10-21 ENCOUNTER — Other Ambulatory Visit: Payer: Self-pay | Admitting: Physician Assistant

## 2021-10-21 DIAGNOSIS — G5603 Carpal tunnel syndrome, bilateral upper limbs: Secondary | ICD-10-CM

## 2021-10-21 DIAGNOSIS — G894 Chronic pain syndrome: Secondary | ICD-10-CM

## 2021-10-21 DIAGNOSIS — M4802 Spinal stenosis, cervical region: Secondary | ICD-10-CM

## 2021-10-21 NOTE — Telephone Encounter (Signed)
Last refill sent 09/29/2021 #90 with no refills Last visit 07/30/2021

## 2021-10-21 NOTE — Telephone Encounter (Signed)
Sent!

## 2021-10-21 NOTE — Telephone Encounter (Signed)
[facility].

## 2021-10-21 NOTE — Telephone Encounter (Signed)
Medication: traMADol (ULTRAM) 50 MG tablet Prior authorization submitted via CoverMyMeds on 10/21/2021 PA submission pending

## 2021-11-18 ENCOUNTER — Other Ambulatory Visit: Payer: Self-pay

## 2021-11-18 DIAGNOSIS — M4802 Spinal stenosis, cervical region: Secondary | ICD-10-CM

## 2021-11-18 DIAGNOSIS — G5603 Carpal tunnel syndrome, bilateral upper limbs: Secondary | ICD-10-CM

## 2021-11-18 DIAGNOSIS — G894 Chronic pain syndrome: Secondary | ICD-10-CM

## 2021-11-18 MED ORDER — TRAMADOL HCL 50 MG PO TABS: ORAL_TABLET | ORAL | 0 refills | Status: DC

## 2021-11-25 ENCOUNTER — Other Ambulatory Visit: Payer: Self-pay | Admitting: Physician Assistant

## 2021-11-25 DIAGNOSIS — N522 Drug-induced erectile dysfunction: Secondary | ICD-10-CM

## 2021-12-17 ENCOUNTER — Other Ambulatory Visit: Payer: Self-pay

## 2021-12-17 DIAGNOSIS — M4802 Spinal stenosis, cervical region: Secondary | ICD-10-CM

## 2021-12-17 DIAGNOSIS — G894 Chronic pain syndrome: Secondary | ICD-10-CM

## 2021-12-17 DIAGNOSIS — G5603 Carpal tunnel syndrome, bilateral upper limbs: Secondary | ICD-10-CM

## 2021-12-17 MED ORDER — TRAMADOL HCL 50 MG PO TABS: ORAL_TABLET | ORAL | 0 refills | Status: DC

## 2021-12-30 ENCOUNTER — Telehealth (INDEPENDENT_AMBULATORY_CARE_PROVIDER_SITE_OTHER): Payer: Self-pay | Admitting: Physician Assistant

## 2021-12-30 ENCOUNTER — Encounter: Payer: Self-pay | Admitting: Physician Assistant

## 2021-12-30 VITALS — Ht 73.0 in | Wt 219.0 lb

## 2021-12-30 DIAGNOSIS — F8081 Childhood onset fluency disorder: Secondary | ICD-10-CM

## 2021-12-30 DIAGNOSIS — Z20822 Contact with and (suspected) exposure to covid-19: Secondary | ICD-10-CM

## 2021-12-30 DIAGNOSIS — Z8709 Personal history of other diseases of the respiratory system: Secondary | ICD-10-CM

## 2021-12-30 MED ORDER — DEXAMETHASONE 4 MG PO TABS: 4.0000 mg | ORAL_TABLET | Freq: Two times a day (BID) | ORAL | 0 refills | Status: DC

## 2021-12-30 NOTE — Progress Notes (Deleted)
Stuttering since Covid last week

## 2021-12-30 NOTE — Progress Notes (Signed)
..Virtual Visit via Video Note  I connected with Thomas Jennings on 12/31/21 at  4:20 PM EST by a video enabled telemedicine application and verified that I am speaking with the correct person using two identifiers.  Location: Patient: car Provider: clinic  .[name] KitchenParticipating in visit:  Patient: Thomas Jennings Provider: Iran Planas PA-C Provider in training: Deidre Ala   I discussed the limitations of evaluation and management by telemedicine and the availability of in person appointments. The patient expressed understanding and agreed to proceed.  History of Present Illness: Pt is a 41 yo male who presents to the clinic with stuttering after URI about 2 weeks ago. He had close positive exposure to covid right before he got sick but never tested positive with home test. He has had covid vaccine x2. He was sick with upper respiratory for a while and treated symptomatically. He had some brain fog and word findind difficulty while having covid and then  After clearing infection pt noticed the stuttering is much worse. He is very anxious and embrassed by this. No SOB, CP, cough, GI symptoms. Not tried anything to make better. No medication changes. No new foods. No loss of smell or tast.  .. Active Ambulatory Problems    Diagnosis Date Noted   Cervical spinal stenosis 03/09/2017   Cervical spondylosis without myelopathy 02/22/2013   Elevated blood pressure reading 03/09/2017   SOB (shortness of breath) 03/09/2017   GAD (generalized anxiety disorder) 03/09/2017   Insomnia 03/09/2017   Essential hypertension 05/11/2017   History of diverticulitis 08/05/2017   Atypical chest pain 08/05/2017   Gastroesophageal reflux disease with esophagitis 08/05/2017   Moderate episode of recurrent major depressive disorder ([place]) 08/05/2017   Carpal tunnel syndrome, bilateral 02/02/2018   Drug-induced erectile dysfunction 04/23/2018   Chronic pain syndrome 07/16/2018   Lipoma of torso 10/21/2018   Blood in  stool 10/21/2018   Pancreatic mass 06/29/2019   Lower abdominal pain 06/29/2019   Left lateral epicondylitis 08/09/2019   Elevated ALT measurement 10/16/2020   Poor concentration 03/18/2021   Bilateral hearing loss 03/18/2021   Insomnia due to mental disorder 08/01/2021   Suspected COVID-19 virus infection 12/30/2021   Stuttering 12/30/2021   History of URI (upper respiratory infection) 12/30/2021   Resolved Ambulatory Problems    Diagnosis Date Noted   No Resolved Ambulatory Problems   Past Medical History:  Diagnosis Date   Cervical spondylosis with myelopathy and radiculopathy 03/09/2017   Primary insomnia 03/09/2017     Observations/Objective: No acute distress Stuttering through most sentences Normal mood and appearance  .[name] Today's Vitals   12/30/21 1557  Weight: 219 lb (99.3 kg)  Height: 6\' 1"  (1.854 m)   Body mass index is 28.89 kg/m.   Assessment and Plan: [name] KitchenMarland KitchenJaksen was seen today for stuttering.  Diagnoses and all orders for this visit:  Stuttering -     dexamethasone (DECADRON) 4 MG tablet; Take 1 tablet (4 mg total) by mouth 2 (two) times daily with a meal. -     Ambulatory referral to Neurology -     Ambulatory referral to Speech Therapy  History of URI (upper respiratory infection) -     dexamethasone (DECADRON) 4 MG tablet; Take 1 tablet (4 mg total) by mouth 2 (two) times daily with a meal. -     Ambulatory referral to Neurology -     Ambulatory referral to Speech Therapy  Suspected COVID-19 virus infection -     dexamethasone (DECADRON) 4 MG tablet; Take 1  tablet (4 mg total) by mouth 2 (two) times daily with a meal. -     Ambulatory referral to Neurology -     Ambulatory referral to Speech Therapy   Discussed with patient there is known evidence of neuro complications after covid to include stuttering. Treatment is vague but centered around speech therapy. Referral placed today. Some evidence supports this happens in response to inflammation. 5  days of dexamethasone sent. Follow up as needed or if symptoms start changing. Referral for neurology for stuttering for second opinion and any advice. Discussed how stress and anxiety can make this worse to to breath and practice relaxation techniques.     Follow Up Instructions:    I discussed the assessment and treatment plan with the patient. The patient was provided an opportunity to ask questions and all were answered. The patient agreed with the plan and demonstrated an understanding of the instructions.   The patient was advised to call back or seek an in-person evaluation if the symptoms worsen or if the condition fails to improve as anticipated.    Iran Planas, PA-C

## 2021-12-31 ENCOUNTER — Encounter: Payer: Self-pay | Admitting: Physician Assistant

## 2022-01-10 ENCOUNTER — Other Ambulatory Visit: Payer: Self-pay

## 2022-01-10 DIAGNOSIS — M4802 Spinal stenosis, cervical region: Secondary | ICD-10-CM

## 2022-01-10 DIAGNOSIS — G5603 Carpal tunnel syndrome, bilateral upper limbs: Secondary | ICD-10-CM

## 2022-01-10 DIAGNOSIS — G894 Chronic pain syndrome: Secondary | ICD-10-CM

## 2022-01-10 MED ORDER — TRAMADOL HCL 50 MG PO TABS: ORAL_TABLET | ORAL | 0 refills | Status: DC

## 2022-01-13 ENCOUNTER — Other Ambulatory Visit: Payer: Self-pay | Admitting: Physician Assistant

## 2022-01-13 DIAGNOSIS — M4802 Spinal stenosis, cervical region: Secondary | ICD-10-CM

## 2022-01-13 DIAGNOSIS — G894 Chronic pain syndrome: Secondary | ICD-10-CM

## 2022-01-13 DIAGNOSIS — G5603 Carpal tunnel syndrome, bilateral upper limbs: Secondary | ICD-10-CM

## 2022-01-27 ENCOUNTER — Other Ambulatory Visit: Payer: Self-pay | Admitting: Physician Assistant

## 2022-01-27 ENCOUNTER — Ambulatory Visit: Payer: 59 | Admitting: Physician Assistant

## 2022-01-27 DIAGNOSIS — Z79899 Other long term (current) drug therapy: Secondary | ICD-10-CM

## 2022-01-27 DIAGNOSIS — Z1329 Encounter for screening for other suspected endocrine disorder: Secondary | ICD-10-CM

## 2022-01-27 DIAGNOSIS — Z13220 Encounter for screening for lipoid disorders: Secondary | ICD-10-CM

## 2022-01-27 DIAGNOSIS — G894 Chronic pain syndrome: Secondary | ICD-10-CM

## 2022-01-27 DIAGNOSIS — I10 Essential (primary) hypertension: Secondary | ICD-10-CM

## 2022-01-27 DIAGNOSIS — N522 Drug-induced erectile dysfunction: Secondary | ICD-10-CM

## 2022-01-27 NOTE — Telephone Encounter (Signed)
Appt today, last seen 12/30/2021 (urg visit), 07/30/2021 (normal follow up) ? ?Last written 11/25/2021 #3 with 5 refills ?

## 2022-02-11 ENCOUNTER — Other Ambulatory Visit: Payer: Self-pay

## 2022-02-11 ENCOUNTER — Encounter: Payer: Self-pay | Admitting: Physician Assistant

## 2022-02-11 DIAGNOSIS — G5603 Carpal tunnel syndrome, bilateral upper limbs: Secondary | ICD-10-CM

## 2022-02-11 DIAGNOSIS — M4802 Spinal stenosis, cervical region: Secondary | ICD-10-CM

## 2022-02-11 DIAGNOSIS — G894 Chronic pain syndrome: Secondary | ICD-10-CM

## 2022-02-11 NOTE — Telephone Encounter (Signed)
His next follow up is scheduled for 02/21/2022 with [facility].  ?

## 2022-02-12 MED ORDER — TRAMADOL HCL 50 MG PO TABS: ORAL_TABLET | ORAL | 0 refills | Status: DC

## 2022-02-21 ENCOUNTER — Ambulatory Visit: Payer: Self-pay | Admitting: Physician Assistant

## 2022-02-24 ENCOUNTER — Encounter: Payer: Self-pay | Admitting: Neurology

## 2022-02-24 ENCOUNTER — Ambulatory Visit: Payer: Self-pay | Admitting: Neurology

## 2022-02-26 ENCOUNTER — Ambulatory Visit: Payer: Self-pay | Admitting: Physician Assistant

## 2022-02-26 DIAGNOSIS — Z79899 Other long term (current) drug therapy: Secondary | ICD-10-CM

## 2022-02-26 DIAGNOSIS — R748 Abnormal levels of other serum enzymes: Secondary | ICD-10-CM

## 2022-02-26 DIAGNOSIS — Z13220 Encounter for screening for lipoid disorders: Secondary | ICD-10-CM

## 2022-02-26 DIAGNOSIS — G894 Chronic pain syndrome: Secondary | ICD-10-CM

## 2022-02-26 DIAGNOSIS — Z1329 Encounter for screening for other suspected endocrine disorder: Secondary | ICD-10-CM

## 2022-02-26 DIAGNOSIS — G5603 Carpal tunnel syndrome, bilateral upper limbs: Secondary | ICD-10-CM

## 2022-02-26 DIAGNOSIS — I10 Essential (primary) hypertension: Secondary | ICD-10-CM

## 2022-02-26 DIAGNOSIS — M4802 Spinal stenosis, cervical region: Secondary | ICD-10-CM

## 2022-03-12 ENCOUNTER — Other Ambulatory Visit: Payer: Self-pay

## 2022-03-12 DIAGNOSIS — G5603 Carpal tunnel syndrome, bilateral upper limbs: Secondary | ICD-10-CM

## 2022-03-12 DIAGNOSIS — M4802 Spinal stenosis, cervical region: Secondary | ICD-10-CM

## 2022-03-12 DIAGNOSIS — G894 Chronic pain syndrome: Secondary | ICD-10-CM

## 2022-03-12 NOTE — Telephone Encounter (Signed)
Left message advising of refusal.  ?

## 2022-03-12 NOTE — Telephone Encounter (Signed)
Thomas Jennings has an appointment on Friday.  ?

## 2022-03-12 NOTE — Telephone Encounter (Signed)
Will fill on Friday. Missed last appt.

## 2022-03-14 ENCOUNTER — Telehealth: Payer: Self-pay | Admitting: Physician Assistant

## 2022-03-14 ENCOUNTER — Ambulatory Visit: Payer: Self-pay | Admitting: Physician Assistant

## 2022-03-14 ENCOUNTER — Other Ambulatory Visit: Payer: Self-pay | Admitting: Family Medicine

## 2022-03-14 DIAGNOSIS — G5603 Carpal tunnel syndrome, bilateral upper limbs: Secondary | ICD-10-CM

## 2022-03-14 DIAGNOSIS — M4802 Spinal stenosis, cervical region: Secondary | ICD-10-CM

## 2022-03-14 DIAGNOSIS — G894 Chronic pain syndrome: Secondary | ICD-10-CM

## 2022-03-14 NOTE — Telephone Encounter (Signed)
Patient came to office for 2:20pm appt. He arrived at 2:41pm - Provider asked to reschedule. Patient stated he really needs his Tramadol refilled - I did reschedule patient for the first available 03/18/22 @ 8am - lmr ?

## 2022-03-14 NOTE — Telephone Encounter (Signed)
Patient last seen for chronic pain appointment in September 2022 (acute visit 12/30/2021). He is supposed to be seen every 3 months for chronic pain management. He is overdue for drug screen and to update pain contract (expired 12/15/2021). He has already been told no on a refill, he has appt scheduled on Tuesday. Please advise.  ?

## 2022-03-14 NOTE — Telephone Encounter (Signed)
Patient advised.

## 2022-03-18 ENCOUNTER — Ambulatory Visit (INDEPENDENT_AMBULATORY_CARE_PROVIDER_SITE_OTHER): Payer: Self-pay

## 2022-03-18 ENCOUNTER — Ambulatory Visit (INDEPENDENT_AMBULATORY_CARE_PROVIDER_SITE_OTHER): Payer: 59 | Admitting: Physician Assistant

## 2022-03-18 ENCOUNTER — Encounter: Payer: Self-pay | Admitting: Physician Assistant

## 2022-03-18 VITALS — BP 138/86 | HR 88 | Ht 73.0 in | Wt 219.0 lb

## 2022-03-18 DIAGNOSIS — E559 Vitamin D deficiency, unspecified: Secondary | ICD-10-CM | POA: Diagnosis not present

## 2022-03-18 DIAGNOSIS — Z79899 Other long term (current) drug therapy: Secondary | ICD-10-CM

## 2022-03-18 DIAGNOSIS — Z1329 Encounter for screening for other suspected endocrine disorder: Secondary | ICD-10-CM

## 2022-03-18 DIAGNOSIS — R052 Subacute cough: Secondary | ICD-10-CM

## 2022-03-18 DIAGNOSIS — K2100 Gastro-esophageal reflux disease with esophagitis, without bleeding: Secondary | ICD-10-CM | POA: Diagnosis not present

## 2022-03-18 DIAGNOSIS — Z13220 Encounter for screening for lipoid disorders: Secondary | ICD-10-CM | POA: Diagnosis not present

## 2022-03-18 DIAGNOSIS — M25551 Pain in right hip: Secondary | ICD-10-CM

## 2022-03-18 DIAGNOSIS — G5603 Carpal tunnel syndrome, bilateral upper limbs: Secondary | ICD-10-CM | POA: Diagnosis not present

## 2022-03-18 DIAGNOSIS — Z131 Encounter for screening for diabetes mellitus: Secondary | ICD-10-CM | POA: Diagnosis not present

## 2022-03-18 DIAGNOSIS — G894 Chronic pain syndrome: Secondary | ICD-10-CM | POA: Diagnosis not present

## 2022-03-18 DIAGNOSIS — R053 Chronic cough: Secondary | ICD-10-CM | POA: Insufficient documentation

## 2022-03-18 DIAGNOSIS — F5105 Insomnia due to other mental disorder: Secondary | ICD-10-CM | POA: Diagnosis not present

## 2022-03-18 DIAGNOSIS — M47812 Spondylosis without myelopathy or radiculopathy, cervical region: Secondary | ICD-10-CM

## 2022-03-18 DIAGNOSIS — M4802 Spinal stenosis, cervical region: Secondary | ICD-10-CM | POA: Diagnosis not present

## 2022-03-18 DIAGNOSIS — I10 Essential (primary) hypertension: Secondary | ICD-10-CM

## 2022-03-18 DIAGNOSIS — R059 Cough, unspecified: Secondary | ICD-10-CM | POA: Diagnosis not present

## 2022-03-18 DIAGNOSIS — M25552 Pain in left hip: Secondary | ICD-10-CM | POA: Diagnosis not present

## 2022-03-18 MED ORDER — TRAMADOL HCL 50 MG PO TABS: ORAL_TABLET | ORAL | 0 refills | Status: DC

## 2022-03-18 MED ORDER — MELOXICAM 15 MG PO TABS: 15.0000 mg | ORAL_TABLET | Freq: Every day | ORAL | 3 refills | Status: DC

## 2022-03-18 MED ORDER — OMEPRAZOLE 40 MG PO CPDR: 40.0000 mg | DELAYED_RELEASE_CAPSULE | Freq: Two times a day (BID) | ORAL | 3 refills | Status: DC

## 2022-03-18 NOTE — Patient Instructions (Addendum)
Nootropics for memory and energy and focus ? ?Increase omeprazole to twice a day to see if helps with cough ? ?PT for upper back and bilateral arm pain ? ? ? ?Hip Bursitis Rehab ?Ask your health care provider which exercises are safe for you. Do exercises exactly as told by your health care provider and adjust them as directed. It is normal to feel mild stretching, pulling, tightness, or discomfort as you do these exercises. Stop right away if you feel sudden pain or your pain gets worse. Do not begin these exercises until told by your health care provider. ?Stretching exercise ?This exercise warms up your muscles and joints and improves the movement and flexibility of your hip. This exercise also helps to relieve pain and stiffness. ?Iliotibial band stretch ?An iliotibial band is a strong band of muscle tissue that runs from the outer side of your hip to the outer side of your thigh and knee. ?Lie on your side with your left / right leg in the top position. ?Bend your left / right knee and grab your ankle. Stretch out your bottom arm to help you balance. ?Slowly bring your knee back so your thigh is slightly behind your body. ?Slowly lower your knee toward the floor until you feel a gentle stretch on the outside of your left / right thigh. If you do not feel a stretch and your knee will not lower more toward the floor, place the heel of your other foot on top of your knee and pull your knee down toward the floor with your foot. ?Hold this position for __________ seconds. ?Slowly return to the starting position. ?Repeat __________ times. Complete this exercise __________ times a day. ?Strengthening exercises ?These exercises build strength and endurance in your hip and pelvis. Endurance is the ability to use your muscles for a long time, even after they get tired. ?Bridge ?This exercise strengthens the muscles that move your thigh backward (hip extensors). ?Lie on your back on a firm surface with your knees bent and  your feet flat on the floor. ?Tighten your buttocks muscles and lift your buttocks off the floor until your trunk is level with your thighs. ?Do not arch your back. ?You should feel the muscles working in your buttocks and the back of your thighs. If you do not feel these muscles, slide your feet 1-2 inches (2.5-5 cm) farther away from your buttocks. ?If this exercise is too easy, try doing it with your arms crossed over your chest. ?Hold this position for __________ seconds. ?Slowly lower your hips to the starting position. ?Let your muscles relax completely after each repetition. ?Repeat __________ times. Complete this exercise __________ times a day. ?Squats ?This exercise strengthens the muscles in front of your thigh and knee (quadriceps). ?Stand in front of a table, with your feet and knees pointing straight ahead. You may rest your hands on the table for balance but not for support. ?Slowly bend your knees and lower your hips like you are going to sit in a chair. ?Keep your weight over your heels, not over your toes. ?Keep your lower legs upright so they are parallel with the table legs. ?Do not let your hips go lower than your knees. ?Do not bend lower than told by your health care provider. ?If your hip pain increases, do not bend as low. ?Hold the squat position for __________ seconds. ?Slowly push with your legs to return to standing. Do not use your hands to pull yourself to standing. ?Repeat  __________ times. Complete this exercise __________ times a day. ?Hip hike ? ?Stand sideways on a bottom step. Stand on your left / right leg with your other foot unsupported next to the step. You can hold on to the railing or wall for balance if needed. ?Keep your knees straight and your torso square. Then lift your left / right hip up toward the ceiling. ?Hold this position for __________ seconds. ?Slowly let your left / right hip lower toward the floor, past the starting position. Your foot should get closer to  the floor. Do not lean or bend your knees. ?Repeat __________ times. Complete this exercise __________ times a day. ?Single leg stand ?This exercise increases your balance. ?Without shoes, stand near a railing or in a doorway. You may hold on to the railing or door frame as needed for balance. ?Squeeze your left / right buttock muscles, then lift up your other foot. ?Do not let your left / right hip push out to the side. ?It is helpful to stand in front of a mirror for this exercise so you can watch your hip. ?Hold this position for __________ seconds. ?Repeat __________ times. Complete this exercise __________ times a day. ?This information is not intended to replace advice given to you by your health care provider. Make sure you discuss any questions you have with your health care provider. ?Document Revised: 10/02/2021 Document Reviewed: 10/02/2021 ?Elsevier Patient Education ? [place]. ? ?

## 2022-03-18 NOTE — Progress Notes (Signed)
Established Patient Office Visit  Subjective   Patient ID: Thomas Jennings, male    DOB: [birth year]  Age: 41 y.o. MRN: [id number]  Chief Complaint  Patient presents with   Follow-up    HPI Pt is a 41 yo male who presents to the clinic for tramadol refills for chronic pain. Pt continues to be in pain but tramadol helps a lot. He did stop cymbalta and lyrica because he thought it was not helping. He continues to take mobic.   He is managing sleep on benadryl. It seems to be helping the most.   He did stop his norvasc and using essential oils. No CP, palpitations, headaches or vision changes.   He does have a dry cough for the last month or so. No trouble breathing. No URI symptoms. Taking omeprazole. No reflux symptoms.  .. Active Ambulatory Problems    Diagnosis Date Noted   Cervical spinal stenosis 03/09/2017   Cervical spondylosis without myelopathy 02/22/2013   Elevated blood pressure reading 03/09/2017   SOB (shortness of breath) 03/09/2017   GAD (generalized anxiety disorder) 03/09/2017   Insomnia 03/09/2017   Essential hypertension 05/11/2017   History of diverticulitis 08/05/2017   Atypical chest pain 08/05/2017   Gastroesophageal reflux disease with esophagitis 08/05/2017   Moderate episode of recurrent major depressive disorder ([place]) 08/05/2017   Carpal tunnel syndrome, bilateral 02/02/2018   Drug-induced erectile dysfunction 04/23/2018   Chronic pain syndrome 07/16/2018   Lipoma of torso 10/21/2018   Blood in stool 10/21/2018   Pancreatic mass 06/29/2019   Lower abdominal pain 06/29/2019   Left lateral epicondylitis 08/09/2019   Elevated ALT measurement 10/16/2020   Poor concentration 03/18/2021   Bilateral hearing loss 03/18/2021   Insomnia due to mental disorder 08/01/2021   Suspected COVID-19 virus infection 12/30/2021   Stuttering 12/30/2021   History of URI (upper respiratory infection) 12/30/2021   Chronic cough 03/18/2022   Vitamin D insufficiency  03/19/2022   Elevated hemoglobin (H[facility]) 03/19/2022   Hypertriglyceridemia 03/19/2022   Right hip pain 03/20/2022   Resolved Ambulatory Problems    Diagnosis Date Noted   No Resolved Ambulatory Problems   Past Medical History:  Diagnosis Date   Cervical spondylosis with myelopathy and radiculopathy 03/09/2017   Primary insomnia 03/09/2017     ROS See HPI.    Objective:     BP 138/86   Pulse 88   Ht '6\' 1"'$  (1.854 m)   Wt 219 lb (99.3 kg)   SpO2 97%   BMI 28.89 kg/m  BP Readings from Last 3 Encounters:  03/18/22 138/86  07/30/21 128/72  03/18/21 (!) 165/95    .[name]    03/18/2022    8:11 AM 07/30/2021    3:23 PM 12/17/2020   11:00 AM 09/17/2020   11:40 AM 02/23/2020    8:54 AM  Depression screen PHQ 2/9  Decreased Interest 0 0 0 0 1  Down, Depressed, Hopeless 0 0 '1 1 1  '$ PHQ - 2 Score 0 0 '1 1 2  '$ Altered sleeping   '1 3 2  '$ Tired, decreased energy   '1 1 2  '$ Change in appetite   0 1 1  Feeling bad or failure about yourself    0 0 1  Trouble concentrating   '3 3 2  '$ Moving slowly or fidgety/restless   1 0 0  Suicidal thoughts   0 0 0  PHQ-9 Score   '7 9 10  '$ Difficult doing work/chores   Somewhat difficult Somewhat difficult Somewhat  difficult      Physical Exam Vitals reviewed.  Constitutional:      Appearance: Normal appearance.  HENT:     Head: Normocephalic.     Right Ear: Tympanic membrane, ear canal and external ear normal.     Left Ear: Tympanic membrane, ear canal and external ear normal. There is no impacted cerumen.     Nose: Nose normal. No congestion or rhinorrhea.     Mouth/Throat:     Mouth: Mucous membranes are moist.     Pharynx: No posterior oropharyngeal erythema.  Eyes:     Conjunctiva/sclera: Conjunctivae normal.  Neck:     Vascular: No carotid bruit.  Cardiovascular:     Rate and Rhythm: Normal rate and regular rhythm.     Pulses: Normal pulses.     Heart sounds: Normal heart sounds.  Pulmonary:     Effort: Pulmonary effort is normal.      Breath sounds: Normal breath sounds.  Musculoskeletal:     Cervical back: Normal range of motion and neck supple.     Right lower leg: No edema.     Left lower leg: No edema.     Comments: NROM of right hip Tenderness to palpation of right greater trochanter  Lymphadenopathy:     Cervical: No cervical adenopathy.  Neurological:     General: No focal deficit present.     Mental Status: He is alert and oriented to person, place, and time.  Psychiatric:        Mood and Affect: Mood normal.         Assessment & Plan:  [name] KitchenMarland KitchenKhiry was seen today for follow-up.  Diagnoses and all orders for this visit:  Chronic pain syndrome -     DRUG MONITORING, PANEL 6 WITH CONFIRMATION, URINE -     DRUG MONITOR, TRAMADOL,QN, URINE -     traMADol (ULTRAM) 50 MG tablet; TAKE ONE TABLET THREE TIMES A DAY FOR PAIN. -     traMADol (ULTRAM) 50 MG tablet; Take one tablet three times a day for pain. -     traMADol (ULTRAM) 50 MG tablet; Take one tablet three times a day for pain. -     DM TEMPLATE  Essential hypertension -     COMPLETE METABOLIC PANEL WITH GFR  Screening for lipid disorders -     Lipid Panel w/reflex Direct LDL  Screening for diabetes mellitus -     COMPLETE METABOLIC PANEL WITH GFR  Thyroid disorder screen -     TSH  Medication management -     TSH -     Lipid Panel w/reflex Direct LDL -     COMPLETE METABOLIC PANEL WITH GFR -     CBC with Differential/Platelet -     DRUG MONITORING, PANEL 6 WITH CONFIRMATION, URINE -     DRUG MONITOR, TRAMADOL,QN, URINE -     DM TEMPLATE  Cervical spondylosis without myelopathy -     Magnesium -     B12 and Folate Panel -     VITAMIN D 25 Hydroxy (Vit-D Deficiency, Fractures) -     DM TEMPLATE  Cervical spinal stenosis -     meloxicam (MOBIC) 15 MG tablet; Take 1 tablet (15 mg total) by mouth daily. -     traMADol (ULTRAM) 50 MG tablet; TAKE ONE TABLET THREE TIMES A DAY FOR PAIN. -     traMADol (ULTRAM) 50 MG tablet; Take one  tablet three times a day for pain. -  traMADol (ULTRAM) 50 MG tablet; Take one tablet three times a day for pain.  Carpal tunnel syndrome, bilateral -     meloxicam (MOBIC) 15 MG tablet; Take 1 tablet (15 mg total) by mouth daily. -     traMADol (ULTRAM) 50 MG tablet; TAKE ONE TABLET THREE TIMES A DAY FOR PAIN. -     traMADol (ULTRAM) 50 MG tablet; Take one tablet three times a day for pain. -     traMADol (ULTRAM) 50 MG tablet; Take one tablet three times a day for pain.  Gastroesophageal reflux disease with esophagitis, unspecified whether hemorrhage -     omeprazole (PRILOSEC) 40 MG capsule; Take 1 capsule (40 mg total) by mouth in the morning and at bedtime.  Insomnia due to mental disorder  Subacute cough -     DG Chest 2 View; Future  Right hip pain -     DG Hip Unilat W OR W/O Pelvis 2-3 Views Right; Future   BP looks good Needs fasting labs .[name] KitchenPDMP reviewed during this encounter. Pain contract signed Tramadol refilled for 3 months Follow up in 6 months Refilled mobic  ?cough etiology  Ordered CXR Increase PPI to bid  Lungs sound great Consider anti-histamine Follow up if not improving in the next 2-4 weeks  Right hip pain likely bursitis Exercises given Continue mobic Will get xray Consider injection if not improving in the next 2 weeks    Return in about 6 months (around 09/18/2022).    Iran Planas, PA-C

## 2022-03-19 ENCOUNTER — Encounter: Payer: Self-pay | Admitting: Physician Assistant

## 2022-03-19 DIAGNOSIS — E781 Pure hyperglyceridemia: Secondary | ICD-10-CM | POA: Insufficient documentation

## 2022-03-19 DIAGNOSIS — E559 Vitamin D deficiency, unspecified: Secondary | ICD-10-CM | POA: Insufficient documentation

## 2022-03-19 DIAGNOSIS — D582 Other hemoglobinopathies: Secondary | ICD-10-CM | POA: Insufficient documentation

## 2022-03-19 NOTE — Progress Notes (Signed)
CXR normal.

## 2022-03-19 NOTE — Progress Notes (Signed)
No acute finds in the bone to represent pain. Do hip bursitis exercises. PT can work with you as well. If not improving. Follow up with sports medicine Dr. Darene Lamer.

## 2022-03-19 NOTE — Progress Notes (Signed)
B12 is normal range but on the lower side. Reasonable to start b12 1058mg daily.  ?Magnesium looks great.  ?Kidney, liver, glucose look great.  ?Sodium and potassium normal.  ?Thyroid looks good.  ?Vitamin D low. Start 1000units daily of D3.  ?Hemoglobin is a little high. Will add ferritin and serum iron. Are you drinking alcohol regularly or smoking cigarettes?  ?TG are elevated. Start fish oil '4000mg'$  a day.  ? ?Recheck labs in 6 months.  ?

## 2022-03-20 DIAGNOSIS — M25551 Pain in right hip: Secondary | ICD-10-CM | POA: Insufficient documentation

## 2022-03-20 LAB — DRUG MONITOR, TRAMADOL,QN, URINE
Desmethyltramadol: NEGATIVE ng/mL (ref <100)
Tramadol: NEGATIVE ng/mL (ref <100)

## 2022-03-20 LAB — DM TEMPLATE

## 2022-03-20 NOTE — Progress Notes (Signed)
Pt was out of tramadol so as expected.

## 2022-03-21 LAB — COMPLETE METABOLIC PANEL WITH GFR
AG Ratio: 1.7 (calc) (ref 1.0–2.5)
ALT: 20 U/L (ref 9–46)
AST: 18 U/L (ref 10–40)
Albumin: 4.7 g/dL (ref 3.6–5.1)
Alkaline phosphatase (APISO): 59 U/L (ref 36–130)
BUN: 10 mg/dL (ref 7–25)
CO2: 21 mmol/L (ref 20–32)
Calcium: 9.5 mg/dL (ref 8.6–10.3)
Chloride: 106 mmol/L (ref 98–110)
Creat: 0.77 mg/dL (ref 0.60–1.29)
Globulin: 2.7 g/dL (calc) (ref 1.9–3.7)
Glucose, Bld: 99 mg/dL (ref 65–99)
Potassium: 4 mmol/L (ref 3.5–5.3)
Sodium: 141 mmol/L (ref 135–146)
Total Bilirubin: 0.9 mg/dL (ref 0.2–1.2)
Total Protein: 7.4 g/dL (ref 6.1–8.1)
eGFR: 115 mL/min/{1.73_m2} (ref >=60)

## 2022-03-21 LAB — CBC WITH DIFFERENTIAL/PLATELET
Absolute Monocytes: 423 cells/uL (ref 200–950)
Basophils Absolute: 82 cells/uL (ref 0–200)
Basophils Relative: 1.6 %
Eosinophils Absolute: 82 cells/uL (ref 15–500)
Eosinophils Relative: 1.6 %
HCT: 49.4 % (ref 38.5–50.0)
Hemoglobin: 17.3 g/dL — ABNORMAL HIGH (ref 13.2–17.1)
Lymphs Abs: 1204 cells/uL (ref 850–3900)
MCH: 30.6 pg (ref 27.0–33.0)
MCHC: 35 g/dL (ref 32.0–36.0)
MCV: 87.3 fL (ref 80.0–100.0)
MPV: 9.7 fL (ref 7.5–12.5)
Monocytes Relative: 8.3 %
Neutro Abs: 3310 cells/uL (ref 1500–7800)
Neutrophils Relative %: 64.9 %
Platelets: 259 10*3/uL (ref 140–400)
RBC: 5.66 10*6/uL (ref 4.20–5.80)
RDW: 13.3 % (ref 11.0–15.0)
Total Lymphocyte: 23.6 %
WBC: 5.1 10*3/uL (ref 3.8–10.8)

## 2022-03-21 LAB — IRON,TIBC AND FERRITIN PANEL
%SAT: 49 % (calc) — ABNORMAL HIGH (ref 20–48)
Ferritin: 166 ng/mL (ref 38–380)
Iron: 195 ug/dL — ABNORMAL HIGH (ref 50–180)
TIBC: 402 mcg/dL (calc) (ref 250–425)

## 2022-03-21 LAB — B12 AND FOLATE PANEL
Folate: 7.3 ng/mL
Vitamin B-12: 416 pg/mL (ref 200–1100)

## 2022-03-21 LAB — LIPID PANEL W/REFLEX DIRECT LDL
Cholesterol: 177 mg/dL (ref <200)
HDL: 69 mg/dL (ref >=40)
LDL Cholesterol (Calc): 76 mg/dL (calc)
Non-HDL Cholesterol (Calc): 108 mg/dL (calc) (ref <130)
Total CHOL/HDL Ratio: 2.6 (calc) (ref <5.0)
Triglycerides: 233 mg/dL — ABNORMAL HIGH (ref <150)

## 2022-03-21 LAB — VITAMIN D 25 HYDROXY (VIT D DEFICIENCY, FRACTURES): Vit D, 25-Hydroxy: 29 ng/mL — ABNORMAL LOW (ref 30–100)

## 2022-03-21 LAB — MAGNESIUM: Magnesium: 2.3 mg/dL (ref 1.5–2.5)

## 2022-03-21 LAB — TSH: TSH: 0.94 mIU/L (ref 0.40–4.50)

## 2022-03-24 ENCOUNTER — Encounter: Payer: Self-pay | Admitting: Neurology

## 2022-03-24 NOTE — Progress Notes (Signed)
Are you taking any extra iron orally?

## 2022-04-15 ENCOUNTER — Other Ambulatory Visit: Payer: Self-pay | Admitting: Physician Assistant

## 2022-04-15 DIAGNOSIS — G894 Chronic pain syndrome: Secondary | ICD-10-CM

## 2022-04-15 DIAGNOSIS — G5603 Carpal tunnel syndrome, bilateral upper limbs: Secondary | ICD-10-CM

## 2022-04-15 DIAGNOSIS — M4802 Spinal stenosis, cervical region: Secondary | ICD-10-CM

## 2022-04-16 ENCOUNTER — Telehealth: Payer: Self-pay

## 2022-04-16 ENCOUNTER — Telehealth: Payer: Self-pay | Admitting: Physician Assistant

## 2022-04-16 NOTE — Telephone Encounter (Signed)
Initiated Prior authorization TXM:IWOEHOZY HCl '50MG'$  tablets Via: Covermymeds Case/Key: [id number] Status: approved as of 04/16/22 Reason: Notified Pt via: Mychart

## 2022-04-16 NOTE — Telephone Encounter (Signed)
Pt called for early refill of tramadol.  [name] KitchenMarland KitchenPDMP reviewed during this encounter. Due 5/16 Ok for early refill on 5/15.  Please take as directed so you should not need early refills.

## 2022-04-16 NOTE — Telephone Encounter (Signed)
Pharmacy informed to refill medication today.  Pt informed as well.  Charyl Bigger, CMA

## 2022-06-16 ENCOUNTER — Other Ambulatory Visit: Payer: Self-pay

## 2022-06-16 DIAGNOSIS — G5603 Carpal tunnel syndrome, bilateral upper limbs: Secondary | ICD-10-CM

## 2022-06-16 DIAGNOSIS — G894 Chronic pain syndrome: Secondary | ICD-10-CM

## 2022-06-16 DIAGNOSIS — M4802 Spinal stenosis, cervical region: Secondary | ICD-10-CM

## 2022-06-18 ENCOUNTER — Other Ambulatory Visit: Payer: Self-pay | Admitting: Physician Assistant

## 2022-06-18 DIAGNOSIS — G5603 Carpal tunnel syndrome, bilateral upper limbs: Secondary | ICD-10-CM

## 2022-06-18 DIAGNOSIS — M4802 Spinal stenosis, cervical region: Secondary | ICD-10-CM

## 2022-06-18 DIAGNOSIS — G894 Chronic pain syndrome: Secondary | ICD-10-CM

## 2022-06-18 MED ORDER — TRAMADOL HCL 50 MG PO TABS: ORAL_TABLET | ORAL | 0 refills | Status: DC

## 2022-06-18 NOTE — Telephone Encounter (Signed)
..  PDMP reviewed during this encounter. No concerns.  UTD appt.  Refilled tramadol.

## 2022-06-18 NOTE — Telephone Encounter (Signed)
Last written 05/18/2022 for one month Last appt 03/18/2022

## 2022-08-14 ENCOUNTER — Other Ambulatory Visit: Payer: Self-pay | Admitting: Physician Assistant

## 2022-08-14 ENCOUNTER — Other Ambulatory Visit: Payer: Self-pay

## 2022-08-14 DIAGNOSIS — M4802 Spinal stenosis, cervical region: Secondary | ICD-10-CM

## 2022-08-14 DIAGNOSIS — G5603 Carpal tunnel syndrome, bilateral upper limbs: Secondary | ICD-10-CM

## 2022-08-14 DIAGNOSIS — G894 Chronic pain syndrome: Secondary | ICD-10-CM

## 2022-08-14 NOTE — Telephone Encounter (Signed)
Follow up visit scheduled for November. Last fill 07/19/22. Pended prescription with a 08/17/22 fill date.

## 2022-08-14 NOTE — Telephone Encounter (Signed)
Tramadol '50mg'$  requested as refill   Last prescription 07/19/2022 Qty #90 ( one month supply)   Last visit  03/18/2022  Next appt 09/19/2022

## 2022-09-08 ENCOUNTER — Encounter: Payer: Self-pay | Admitting: Physician Assistant

## 2022-09-08 DIAGNOSIS — G894 Chronic pain syndrome: Secondary | ICD-10-CM

## 2022-09-08 DIAGNOSIS — M4802 Spinal stenosis, cervical region: Secondary | ICD-10-CM

## 2022-09-08 DIAGNOSIS — K2100 Gastro-esophageal reflux disease with esophagitis, without bleeding: Secondary | ICD-10-CM

## 2022-09-08 DIAGNOSIS — G5603 Carpal tunnel syndrome, bilateral upper limbs: Secondary | ICD-10-CM

## 2022-09-08 MED ORDER — TRAMADOL HCL 50 MG PO TABS: ORAL_TABLET | ORAL | 0 refills | Status: DC

## 2022-09-19 ENCOUNTER — Ambulatory Visit: Payer: 59 | Admitting: Physician Assistant

## 2022-10-06 ENCOUNTER — Other Ambulatory Visit: Payer: Self-pay | Admitting: Physician Assistant

## 2022-10-06 DIAGNOSIS — M4802 Spinal stenosis, cervical region: Secondary | ICD-10-CM

## 2022-10-06 DIAGNOSIS — G5603 Carpal tunnel syndrome, bilateral upper limbs: Secondary | ICD-10-CM

## 2022-10-06 DIAGNOSIS — G894 Chronic pain syndrome: Secondary | ICD-10-CM

## 2022-10-06 NOTE — Telephone Encounter (Signed)
Last written 09/08/2022 for a one month supply  Last appt 03/18/2022  He has appt scheduled for December 18th.   He is on a pain contract.   Pended medication with note that states keep appt for further refills. Please advise.

## 2022-10-20 ENCOUNTER — Ambulatory Visit: Payer: 59 | Admitting: Physician Assistant

## 2022-11-02 ENCOUNTER — Encounter: Payer: Self-pay | Admitting: Physician Assistant

## 2022-11-03 ENCOUNTER — Ambulatory Visit (HOSPITAL_COMMUNITY): Admit: 2022-11-03 | Payer: Self-pay

## 2022-11-03 ENCOUNTER — Other Ambulatory Visit: Payer: Self-pay | Admitting: Family Medicine

## 2022-11-03 DIAGNOSIS — G5603 Carpal tunnel syndrome, bilateral upper limbs: Secondary | ICD-10-CM

## 2022-11-03 DIAGNOSIS — G894 Chronic pain syndrome: Secondary | ICD-10-CM

## 2022-11-03 DIAGNOSIS — M4802 Spinal stenosis, cervical region: Secondary | ICD-10-CM

## 2022-11-04 ENCOUNTER — Telehealth: Payer: Self-pay | Admitting: General Practice

## 2022-11-04 NOTE — Telephone Encounter (Signed)
Transition Care Management Unsuccessful Follow-up Telephone Call  Date of discharge and from where:  11/02/22 From [facility]  Attempts:  1st Attempt  Reason for unsuccessful TCM follow-up call:  Left voice message

## 2022-11-07 ENCOUNTER — Emergency Department (HOSPITAL_COMMUNITY)
Admission: EM | Admit: 2022-11-07 | Discharge: 2022-11-07 | Discharge status: Against Medical Advice | Payer: Self-pay | Attending: Emergency Medicine | Admitting: Emergency Medicine

## 2022-11-07 ENCOUNTER — Encounter (HOSPITAL_COMMUNITY): Payer: Self-pay

## 2022-11-07 DIAGNOSIS — R519 Headache, unspecified: Secondary | ICD-10-CM | POA: Insufficient documentation

## 2022-11-07 DIAGNOSIS — Y908 Blood alcohol level of 240 mg/100 ml or more: Secondary | ICD-10-CM | POA: Insufficient documentation

## 2022-11-07 DIAGNOSIS — F1010 Alcohol abuse, uncomplicated: Secondary | ICD-10-CM | POA: Insufficient documentation

## 2022-11-07 LAB — URINALYSIS, ROUTINE W REFLEX MICROSCOPIC
Bacteria, UA: NONE SEEN
Bilirubin Urine: NEGATIVE
Glucose, UA: NEGATIVE mg/dL
Ketones, ur: NEGATIVE mg/dL
Leukocytes,Ua: NEGATIVE
Nitrite: NEGATIVE
Protein, ur: NEGATIVE mg/dL
Specific Gravity, Urine: 1.012 (ref 1.005–1.030)
pH: 6 (ref 5.0–8.0)

## 2022-11-07 LAB — RAPID URINE DRUG SCREEN, HOSP PERFORMED
Amphetamines: NOT DETECTED
Barbiturates: NOT DETECTED
Benzodiazepines: POSITIVE — AB
Cocaine: NOT DETECTED
Opiates: NOT DETECTED
Tetrahydrocannabinol: NOT DETECTED

## 2022-11-07 LAB — COMPREHENSIVE METABOLIC PANEL
ALT: 86 U/L — ABNORMAL HIGH (ref 0–44)
AST: 85 U/L — ABNORMAL HIGH (ref 15–41)
Albumin: 4.3 g/dL (ref 3.5–5.0)
Alkaline Phosphatase: 65 U/L (ref 38–126)
Anion gap: 11 (ref 5–15)
BUN: 11 mg/dL (ref 6–20)
CO2: 23 mmol/L (ref 22–32)
Calcium: 8.8 mg/dL — ABNORMAL LOW (ref 8.9–10.3)
Chloride: 105 mmol/L (ref 98–111)
Creatinine, Ser: 0.63 mg/dL (ref 0.61–1.24)
GFR, Estimated: >60 mL/min (ref >60)
Glucose, Bld: 97 mg/dL (ref 70–99)
Potassium: 3.8 mmol/L (ref 3.5–5.1)
Sodium: 139 mmol/L (ref 135–145)
Total Bilirubin: 0.3 mg/dL (ref 0.3–1.2)
Total Protein: 7.7 g/dL (ref 6.5–8.1)

## 2022-11-07 LAB — CBC WITH DIFFERENTIAL/PLATELET
Abs Immature Granulocytes: 0.02 10*3/uL (ref 0.00–0.07)
Basophils Absolute: 0.1 10*3/uL (ref 0.0–0.1)
Basophils Relative: 1 %
Eosinophils Absolute: 0.2 10*3/uL (ref 0.0–0.5)
Eosinophils Relative: 3 %
HCT: 50.5 % (ref 39.0–52.0)
Hemoglobin: 17 g/dL (ref 13.0–17.0)
Immature Granulocytes: 0 %
Lymphocytes Relative: 34 %
Lymphs Abs: 2 10*3/uL (ref 0.7–4.0)
MCH: 30.8 pg (ref 26.0–34.0)
MCHC: 33.7 g/dL (ref 30.0–36.0)
MCV: 91.5 fL (ref 80.0–100.0)
Monocytes Absolute: 0.6 10*3/uL (ref 0.1–1.0)
Monocytes Relative: 10 %
Neutro Abs: 3.1 10*3/uL (ref 1.7–7.7)
Neutrophils Relative %: 52 %
Platelets: 211 10*3/uL (ref 150–400)
RBC: 5.52 MIL/uL (ref 4.22–5.81)
RDW: 13.9 % (ref 11.5–15.5)
WBC: 6 10*3/uL (ref 4.0–10.5)
nRBC: 0 % (ref 0.0–0.2)

## 2022-11-07 LAB — LIPASE, BLOOD: Lipase: 39 U/L (ref 11–51)

## 2022-11-07 LAB — ETHANOL: Alcohol, Ethyl (B): 245 mg/dL — ABNORMAL HIGH (ref <10)

## 2022-11-07 MED ORDER — LORAZEPAM 1 MG PO TABS
2.0000 mg | ORAL_TABLET | Freq: Once | ORAL | Status: DC
  Filled 2022-11-07: qty 2

## 2022-11-07 NOTE — ED Provider Notes (Signed)
[facility] DEPT Provider Note   CSN: [id number] Arrival date & time: 11/07/22  1830     History  Chief Complaint  Patient presents with   Alcohol Problem    Thomas Jennings is a 42 y.o. male.  HPI 42 year old male presents with wanting alcohol detox.  He has been doing on and off alcohol drinking.  He drinks many seltzers per day.  Recently went to an outside hospital, was prescribed Librium and just finished that.  However he last drank this morning.  Friends have brought him in to get detox as after he gets detox he can go to a specific rehab facility that he has been accepted to.  The patient states he feels "confused" which she explains as difficulty getting his words out.  This happens every time.  Otherwise, he does not feel particularly shaky or have focal weakness.  He does have a moderate 5/10 headache.  He has chronic depressive symptoms but denies any worsening depression or suicidal/homicidal thoughts.  His friends have brought him in to help get detox going and are worried about him.  They state that he recently got divorced and has had stress at his job.  Home Medications Prior to Admission medications   Medication Sig Start Date End Date Taking? Authorizing Provider  meloxicam (MOBIC) 15 MG tablet Take 1 tablet (15 mg total) by mouth daily. 03/18/22   Breeback, Royetta Car, PA-C  omeprazole (PRILOSEC) 40 MG capsule Take 1 capsule (40 mg total) by mouth in the morning and at bedtime. 03/18/22   Breeback, Jade L, PA-C  sildenafil (VIAGRA) 100 MG tablet TAKE 1 TABLET BY MOUTH EVERY DAY AS NEEDED FOR ERECTILE DYSFUNCTION 01/27/22   Breeback, Jade L, PA-C  traMADol (ULTRAM) 50 MG tablet Take one tablet three times a day for pain. 04/18/22   Breeback, Jade L, PA-C  traMADol (ULTRAM) 50 MG tablet TAKE ONE TABLET THREE TIMES A DAY FOR PAIN. 07/19/22   Iran Planas L, PA-C  traMADol (ULTRAM) 50 MG tablet TAKE ONE TABLET THREE TIMES A DAY FOR PAIN/ keep appt  for refills 10/06/22   Hali Marry, MD      Allergies    Gabapentin and Lisinopril    Review of Systems   Review of Systems  Constitutional:  Negative for fever.  Cardiovascular:  Negative for chest pain.  Gastrointestinal:  Negative for abdominal pain and vomiting.  Neurological:  Positive for tremors (mild) and headaches.  Psychiatric/Behavioral:  Positive for confusion.     Physical Exam Updated Vital Signs BP 133/82   Pulse 93   Temp 98.6 F (37 C)   Resp (!) 24   Ht '6\' 1"'$  (1.854 m)   Wt 90.7 kg   SpO2 96%   BMI 26.39 kg/m  Physical Exam Vitals and nursing note reviewed.  Constitutional:      Appearance: He is well-developed.  HENT:     Head: Normocephalic and atraumatic.  Eyes:     Extraocular Movements: Extraocular movements intact.     Pupils: Pupils are equal, round, and reactive to light.  Cardiovascular:     Rate and Rhythm: Normal rate and regular rhythm.     Heart sounds: Normal heart sounds.  Pulmonary:     Effort: Pulmonary effort is normal.     Breath sounds: Normal breath sounds.  Abdominal:     Palpations: Abdomen is soft.     Tenderness: There is no abdominal tenderness.  Musculoskeletal:  Cervical back: No rigidity.  Skin:    General: Skin is warm and dry.  Neurological:     Mental Status: He is alert and oriented to person, place, and time.     Comments: CN 3-12 grossly intact. 5/5 strength in all 4 extremities. Grossly normal sensation. Normal finger to nose.  He does have some on and off stuttering, but otherwise is able to speak and is not altered.     ED Results / Procedures / Treatments   Labs (all labs ordered are listed, but only abnormal results are displayed) Labs Reviewed  COMPREHENSIVE METABOLIC PANEL - Abnormal; Notable for the following components:      Result Value   Calcium 8.8 (*)    AST 85 (*)    ALT 86 (*)    All other components within normal limits  ETHANOL - Abnormal; Notable for the following  components:   Alcohol, Ethyl (B) 245 (*)    All other components within normal limits  URINALYSIS, ROUTINE W REFLEX MICROSCOPIC - Abnormal; Notable for the following components:   Hgb urine dipstick SMALL (*)    All other components within normal limits  RAPID URINE DRUG SCREEN, HOSP PERFORMED - Abnormal; Notable for the following components:   Benzodiazepines POSITIVE (*)    All other components within normal limits  CBC WITH DIFFERENTIAL/PLATELET  LIPASE, BLOOD    EKG EKG Interpretation  Date/Time:  Friday November 07 2022 20:52:14 EST Ventricular Rate:  92 PR Interval:  159 QRS Duration: 108 QT Interval:  357 QTC Calculation: 442 R Axis:   20 Text Interpretation: Sinus rhythm Borderline repolarization abnormality No old tracing to compare Confirmed by Sherwood Gambler [phone]) on 11/07/2022 9:23:30 PM  Radiology No results found.  Procedures Procedures    Medications Ordered in ED Medications - No data to display   ED Course/ Medical Decision Making/ A&P                           Medical Decision Making Amount and/or Complexity of Data Reviewed External Data Reviewed: notes. Labs:     Details: WBC normal.  Mild LFT abnormalities go along with alcohol abuse ECG/medicine tests: independent interpretation performed.    Details: No old to compare to, nonspecific changes.  Risk Prescription drug management.   Patient presents with alcohol abuse and wanting detox.  However it turns out is really more of his friends want him to get detox.  Patient does not want to be here.  When I am seeing him, and after discussion with nurse who originally triage him, it seems like his mental status is improving and I suspect he was more intoxicated than anything else.  He never got the Ativan and so I do not think it was withdrawal that improved.  Overall, I was told that he walked out and I was able to talk to him outside of the emergency department but he still does not want to be  admitted or stay in the hospital.  At this point, he is alert, oriented, and appears capable of making medical decisions for himself.  We discussed potential adverse effects including up to death from leaving without treatment and he understands.  He will leave [facility].  He was encouraged he can return at any time.        Final Clinical Impression(s) / ED Diagnoses Final diagnoses:  Alcohol abuse    Rx / DC Orders ED Discharge Orders  None         Sherwood Gambler, MD 11/07/22 2350

## 2022-11-07 NOTE — ED Notes (Signed)
CIWA of 17

## 2022-11-07 NOTE — ED Provider Triage Note (Cosign Needed)
Emergency Medicine Provider Triage Evaluation Note  Thomas Jennings , a 42 y.o. male  was evaluated in triage.  Pt complains of requesting alcohol detoxification.  Typically drinks 12-15 100 seltzers a day.  Was started on Librium taper by his primary care provider in December.  Finished this recently.  And then began drinking again today.  Requesting inpatient detox.  Denies other symptoms.  Review of Systems  Positive: As above Negative: As above  Physical Exam  BP (!) 138/96   Pulse (!) 101   Temp 98.6 F (37 C)   Resp 20   Ht '6\' 1"'$  (1.854 m)   Wt 90.7 kg   SpO2 93%   BMI 26.39 kg/m  Gen:   Awake, no distress, some disorientation and confusion Resp:  Normal effort  MSK:   Moves extremities without difficulty, no tremors Other:    Medical Decision Making  Medically screening exam initiated at 8:38 PM.  Appropriate orders placed.  Jaedan Huttner was informed that the remainder of the evaluation will be completed by another provider, this initial triage assessment does not replace that evaluation, and the importance of remaining in the ED until their evaluation is complete.     Roylene Reason, [place] 11/07/22 2039

## 2022-11-07 NOTE — ED Triage Notes (Signed)
Pt is coming from home with family  Pt reports last drink was this morning he says he drinks 15 hard seltzers a day. Pt is confused and struggling to talk. Pt recently prescribed chlordiazepox by primary. Pt is looking to detox.

## 2022-11-07 NOTE — Telephone Encounter (Signed)
Transition Care Management Unsuccessful Follow-up Telephone Call  Date of discharge and from where:  11/02/22 from [facility]  Attempts:  2nd Attempt  Reason for unsuccessful TCM follow-up call:  Left voice message

## 2022-11-10 ENCOUNTER — Telehealth: Payer: Self-pay | Admitting: General Practice

## 2022-11-10 DIAGNOSIS — F1010 Alcohol abuse, uncomplicated: Secondary | ICD-10-CM

## 2022-11-10 NOTE — Telephone Encounter (Signed)
Transition Care Management Follow-up Telephone Call Date of discharge and from where: 11/07/22 from [facility]  How have you been since you were released from the hospital? Patient stated that he does not have insurance at the time. He does not have any insurance right now.  Any questions or concerns? No  Items Reviewed: Did the pt receive and understand the discharge instructions provided? Yes  Medications obtained and verified? Yes  Other? No  Any new allergies since your discharge? No  Dietary orders reviewed? Yes Do you have support at home? Yes   Home Care and Equipment/Supplies: Were home health services ordered? no   Functional Questionnaire: (I = Independent and D = Dependent) ADLs: I  Bathing/Dressing- I  Meal Prep- I  Eating- I  Maintaining continence- I  Transferring/Ambulation- I  Managing Meds- I  Follow up appointments reviewed:  PCP Hospital f/u appt confirmed? No   Specialist Hospital f/u appt confirmed? No   Are transportation arrangements needed? No  If their condition worsens, is the pt aware to call PCP or go to the Emergency Dept.? Yes Was the patient provided with contact information for the PCP's office or ED? Yes Was to pt encouraged to call back with questions or concerns? Yes

## 2022-11-10 NOTE — Telephone Encounter (Signed)
Transition Care Management Unsuccessful Follow-up Telephone Call  Date of discharge and from where:  11/02/22 from [facility]  Attempts:  3rd Attempt  Reason for unsuccessful TCM follow-up call:  Left voice message

## 2022-11-10 NOTE — Addendum Note (Signed)
Addended by: Donella Stade on: 11/10/2022 05:39 PM   Modules accepted: Orders

## 2022-11-10 NOTE — Telephone Encounter (Signed)
At this time due to alcohol intake and abuse we will not refill any tramadol. Referral placed for alcohol abuse clinic.

## 2022-11-24 ENCOUNTER — Telehealth: Payer: Self-pay

## 2022-11-24 NOTE — Telephone Encounter (Signed)
He cannot be prescribed controlled substances while drinking alcohol and he has had 2 ED visits for binge drinking in the last month. Drinking alcohol breaks the pain contract.

## 2022-11-24 NOTE — Telephone Encounter (Signed)
He states he has stopped drinking.

## 2022-11-24 NOTE — Telephone Encounter (Signed)
Thomas Jennings called and left a message stating he needs a refill on Tramadol. This is not on his current medication list. Last fill was 10/06/22.

## 2022-11-25 ENCOUNTER — Telehealth: Payer: Self-pay

## 2022-11-25 NOTE — Telephone Encounter (Signed)
Patient advised.

## 2022-11-25 NOTE — Telephone Encounter (Signed)
Transition Care Management Unsuccessful Follow-up Telephone Call  Date of discharge and from where:  Thomas Jennings 11/24/2022  Attempts:  1st Attempt  Reason for unsuccessful TCM follow-up call:  Left voice message Thomas Jennings, [name] Direct Dial [phone]

## 2022-11-26 ENCOUNTER — Inpatient Hospital Stay: Payer: Self-pay | Admitting: Physician Assistant

## 2022-11-26 NOTE — Telephone Encounter (Signed)
Transition Care Management Unsuccessful Follow-up Telephone Call  Date of discharge and from where:  Thomas Jennings 11/24/2022  Attempts:  2nd Attempt  Reason for unsuccessful TCM follow-up call:  Left voice message Juanda Crumble, [place] Direct Dial [phone]

## 2022-11-27 NOTE — Telephone Encounter (Signed)
Already seen Juanda Crumble, [place] Direct Dial [phone]

## 2022-12-08 ENCOUNTER — Telehealth: Payer: Self-pay | Admitting: General Practice

## 2022-12-08 NOTE — Telephone Encounter (Signed)
Transition Care Management Unsuccessful Follow-up Telephone Call  Date of discharge and from where:  12/04/22 from [facility]  Attempts:  1st Attempt  Reason for unsuccessful TCM follow-up call:  Left voice message

## 2022-12-12 NOTE — Telephone Encounter (Signed)
Transition Care Management Unsuccessful Follow-up Telephone Call  Date of discharge and from where:  12/04/22 from [facility]  Attempts:  2nd Attempt  Reason for unsuccessful TCM follow-up call:  Left voice message

## 2022-12-15 NOTE — Telephone Encounter (Signed)
Transition Care Management Unsuccessful Follow-up Telephone Call  Date of discharge and from where:  12/04/22 from [facility]  Attempts:  3rd Attempt  Reason for unsuccessful TCM follow-up call:  Left voice message

## 2023-03-16 ENCOUNTER — Ambulatory Visit (INDEPENDENT_AMBULATORY_CARE_PROVIDER_SITE_OTHER): Payer: Self-pay | Admitting: Physician Assistant

## 2023-03-16 VITALS — BP 141/92 | HR 89 | Ht 73.0 in | Wt 237.0 lb

## 2023-03-16 DIAGNOSIS — G5603 Carpal tunnel syndrome, bilateral upper limbs: Secondary | ICD-10-CM

## 2023-03-16 DIAGNOSIS — G894 Chronic pain syndrome: Secondary | ICD-10-CM

## 2023-03-16 DIAGNOSIS — I10 Essential (primary) hypertension: Secondary | ICD-10-CM

## 2023-03-16 DIAGNOSIS — M4802 Spinal stenosis, cervical region: Secondary | ICD-10-CM

## 2023-03-16 DIAGNOSIS — M47812 Spondylosis without myelopathy or radiculopathy, cervical region: Secondary | ICD-10-CM

## 2023-03-16 DIAGNOSIS — F1021 Alcohol dependence, in remission: Secondary | ICD-10-CM

## 2023-03-16 MED ORDER — PREGABALIN 75 MG PO CAPS: 75.0000 mg | ORAL_CAPSULE | Freq: Two times a day (BID) | ORAL | 3 refills | Status: DC

## 2023-03-16 MED ORDER — TRAMADOL HCL 50 MG PO TABS: 50.0000 mg | ORAL_TABLET | Freq: Two times a day (BID) | ORAL | 0 refills | Status: AC | PRN

## 2023-03-16 MED ORDER — LOSARTAN POTASSIUM 25 MG PO TABS
25.0000 mg | ORAL_TABLET | Freq: Every day | ORAL | 0 refills | Status: DC
Start: 2023-03-16 — End: 2023-06-29

## 2023-03-16 MED ORDER — MELOXICAM 15 MG PO TABS: 15.0000 mg | ORAL_TABLET | Freq: Every day | ORAL | 3 refills | Status: DC

## 2023-03-16 NOTE — Patient Instructions (Addendum)
Tramadol no more than once day for break through pain.  Lyrica twice a day Mobic once a day Losartan once a day for BP.   [employer]- [employer]

## 2023-03-16 NOTE — Progress Notes (Signed)
Established Patient Office Visit  Subjective   Patient ID: Thomas Jennings, male    DOB: January 20, 1981  Age: 42 y.o. MRN: [id number]  Chief Complaint  Patient presents with   Follow-up    Shoulder pain  tramadol  refill,    HPI Pt is a 42 yo male who presents to the clinic wanting to get back on medication and back on track. He is 100 days sober. He finished inpatient treatment plan with [facility]. He feels like this was benefical. He has lost everything including his house but relationship with daughter and ex-wife is much better. He now lives in shelter and undergoes regular drug testing.   He wants to get a job but his neck and left arm pain is worsening. He is not on any medication.    .. Active Ambulatory Problems    Diagnosis Date Noted   Cervical spinal stenosis 03/09/2017   Cervical spondylosis without myelopathy 02/22/2013   Elevated blood pressure reading 03/09/2017   SOB (shortness of breath) 03/09/2017   GAD (generalized anxiety disorder) 03/09/2017   Insomnia 03/09/2017   Essential hypertension 05/11/2017   History of diverticulitis 08/05/2017   Atypical chest pain 08/05/2017   Gastroesophageal reflux disease with esophagitis 08/05/2017   Moderate episode of recurrent major depressive disorder (HCC) 08/05/2017   Carpal tunnel syndrome, bilateral 02/02/2018   Drug-induced erectile dysfunction 04/23/2018   Chronic pain syndrome 07/16/2018   Lipoma of torso 10/21/2018   Blood in stool 10/21/2018   Pancreatic mass 06/29/2019   Lower abdominal pain 06/29/2019   Left lateral epicondylitis 08/09/2019   Elevated ALT measurement 10/16/2020   Poor concentration 03/18/2021   Bilateral hearing loss 03/18/2021   Insomnia due to mental disorder 08/01/2021   Suspected COVID-19 virus infection 12/30/2021   Stuttering 12/30/2021   History of URI (upper respiratory infection) 12/30/2021   Chronic cough 03/18/2022   Vitamin D insufficiency 03/19/2022   Elevated  hemoglobin (HCC) 03/19/2022   Hypertriglyceridemia 03/19/2022   Right hip pain 03/20/2022   Alcohol abuse 11/10/2022   Resolved Ambulatory Problems    Diagnosis Date Noted   No Resolved Ambulatory Problems   Past Medical History:  Diagnosis Date   Cervical spondylosis with myelopathy and radiculopathy 03/09/2017   Primary insomnia 03/09/2017     ROS   See HPI.  Objective:     BP (!) 141/92   Pulse 89   Ht 6\' 1"  (1.854 m)   Wt 237 lb (107.5 kg)   SpO2 99%   BMI 31.27 kg/m  BP Readings from Last 3 Encounters:  03/16/23 (!) 141/92  11/07/22 133/82  03/18/22 138/86   Wt Readings from Last 3 Encounters:  03/16/23 237 lb (107.5 kg)  11/07/22 200 lb (90.7 kg)  03/18/22 219 lb (99.3 kg)   ..    03/18/2022    8:11 AM 07/30/2021    3:23 PM 12/17/2020   11:00 AM 09/17/2020   11:40 AM 02/23/2020    8:54 AM  Depression screen PHQ 2/9  Decreased Interest 0 0 0 0 1  Down, Depressed, Hopeless 0 0 1 1 1   PHQ - 2 Score 0 0 1 1 2   Altered sleeping   1 3 2   Tired, decreased energy   1 1 2   Change in appetite   0 1 1  Feeling bad or failure about yourself    0 0 1  Trouble concentrating   3 3 2   Moving slowly or fidgety/restless   1 0 0  Suicidal thoughts   0 0 0  PHQ-9 Score   7 9 10   Difficult doing work/chores   Somewhat difficult Somewhat difficult Somewhat difficult         Physical Exam Constitutional:      Appearance: Normal appearance.  HENT:     Head: Normocephalic.  Cardiovascular:     Rate and Rhythm: Normal rate and regular rhythm.  Pulmonary:     Effort: Pulmonary effort is normal.  Musculoskeletal:     Comments: Left upper extremity weaker than right.   Neurological:     General: No focal deficit present.     Mental Status: He is alert and oriented to person, place, and time.  Psychiatric:        Mood and Affect: Mood normal.        Assessment & Plan:  [name] KitchenMarland KitchenNahuel was seen today for follow-up.  Diagnoses and all orders for this visit:  Chronic  pain syndrome -     pregabalin (LYRICA) 75 MG capsule; Take 1 capsule (75 mg total) by mouth 2 (two) times daily. -     traMADol (ULTRAM) 50 MG tablet; Take 1 tablet (50 mg total) by mouth every 12 (twelve) hours as needed for up to 5 days.  Cervical spinal stenosis -     pregabalin (LYRICA) 75 MG capsule; Take 1 capsule (75 mg total) by mouth 2 (two) times daily. -     traMADol (ULTRAM) 50 MG tablet; Take 1 tablet (50 mg total) by mouth every 12 (twelve) hours as needed for up to 5 days. -     meloxicam (MOBIC) 15 MG tablet; Take 1 tablet (15 mg total) by mouth daily.  Essential hypertension -     losartan (COZAAR) 25 MG tablet; Take 1 tablet (25 mg total) by mouth daily.  Cervical spondylosis without myelopathy -     pregabalin (LYRICA) 75 MG capsule; Take 1 capsule (75 mg total) by mouth 2 (two) times daily. -     traMADol (ULTRAM) 50 MG tablet; Take 1 tablet (50 mg total) by mouth every 12 (twelve) hours as needed for up to 5 days.  Carpal tunnel syndrome, bilateral -     pregabalin (LYRICA) 75 MG capsule; Take 1 capsule (75 mg total) by mouth 2 (two) times daily. -     traMADol (ULTRAM) 50 MG tablet; Take 1 tablet (50 mg total) by mouth every 12 (twelve) hours as needed for up to 5 days. -     meloxicam (MOBIC) 15 MG tablet; Take 1 tablet (15 mg total) by mouth daily.  Alcoholism in remission (HCC)    100 days sober from alcohol, GREAT job He finished inpatient treatment program He will maintain AA weekly meetings He is regularly drug tested for shelter he is staying in  Restart medication Lyrica/tramadol/mobic  BP not to goal Start losartan 25mg  daily   Get insurance coverage to make referral and get you help.  Gave numbers and options  Follow up in 6 weeks  Tandy Gaw, PA-C

## 2023-03-20 ENCOUNTER — Encounter: Payer: Self-pay | Admitting: Physician Assistant

## 2023-04-10 ENCOUNTER — Telehealth: Payer: Self-pay | Admitting: Physician Assistant
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
## 2023-04-10 NOTE — Telephone Encounter (Signed)
Attempted call to patient. Left detailed voice mail message on home number and sent Mychart message to patient as well.

## 2023-04-13 ENCOUNTER — Ambulatory Visit: Payer: Self-pay | Admitting: Physician Assistant

## 2023-04-15 NOTE — Telephone Encounter (Signed)
Attempted call - automated message states call can not be completed as dialed but checked this number 3 times and still same message. Could not leave a detailed voicemail message.

## 2023-04-17 NOTE — Telephone Encounter (Signed)
Letter placed in mail to patient.

## 2023-05-01 NOTE — Telephone Encounter (Signed)
Letter came back in returned mail on [phone] not deliverable phone is disconnected

## 2023-05-13 ENCOUNTER — Telehealth: Payer: Self-pay | Admitting: Physician Assistant

## 2023-05-13 NOTE — Telephone Encounter (Signed)
Patient called decided to make an appointment to come in and speak with provider

## 2023-05-14 ENCOUNTER — Other Ambulatory Visit: Payer: Self-pay | Admitting: Physician Assistant

## 2023-05-14 ENCOUNTER — Telehealth: Payer: Self-pay | Admitting: Physician Assistant

## 2023-05-14 DIAGNOSIS — G894 Chronic pain syndrome: Secondary | ICD-10-CM

## 2023-05-14 DIAGNOSIS — M4802 Spinal stenosis, cervical region: Secondary | ICD-10-CM

## 2023-05-14 DIAGNOSIS — G5603 Carpal tunnel syndrome, bilateral upper limbs: Secondary | ICD-10-CM

## 2023-05-14 DIAGNOSIS — M47812 Spondylosis without myelopathy or radiculopathy, cervical region: Secondary | ICD-10-CM

## 2023-05-14 NOTE — Telephone Encounter (Signed)
Rx not listed in active med list.  

## 2023-05-14 NOTE — Telephone Encounter (Signed)
Patient is requesting a refill on Tramadol 50mg   Please submit to [facility] [phone]

## 2023-05-20 ENCOUNTER — Ambulatory Visit: Payer: Self-pay | Admitting: Physician Assistant

## 2023-05-25 ENCOUNTER — Ambulatory Visit: Payer: Self-pay | Admitting: Physician Assistant

## 2023-06-15 ENCOUNTER — Telehealth: Payer: Self-pay

## 2023-06-15 NOTE — Transitions of Care (Post Inpatient/ED Visit) (Signed)
   06/15/2023  Name: Thomas Jennings MRN: [id number] DOB: 02-19-1981  Today's TOC FU Call Status: Today's TOC FU Call Status:: Unsuccessful Call (1st Attempt) Unsuccessful Call (1st Attempt) Date: 06/15/23  Attempted to reach the patient regarding the most recent Inpatient/ED visit.  Follow Up Plan: Additional outreach attempts will be made to reach the patient to complete the Transitions of Care (Post Inpatient/ED visit) call.   Signature Karena Addison, LPN [facility] Nurse Health Advisor Direct Dial [phone]

## 2023-06-16 NOTE — Transitions of Care (Post Inpatient/ED Visit) (Signed)
   06/16/2023  Name: Thomas Jennings MRN: [id number] DOB: July 07, 1981  Today's TOC FU Call Status: Today's TOC FU Call Status:: Unsuccessful Call (2nd Attempt) Unsuccessful Call (1st Attempt) Date: 06/15/23 Unsuccessful Call (2nd Attempt) Date: 06/16/23  Attempted to reach the patient regarding the most recent Inpatient/ED visit.  Follow Up Plan: Additional outreach attempts will be made to reach the patient to complete the Transitions of Care (Post Inpatient/ED visit) call.   Signature Karena Addison, LPN [facility] Nurse Health Advisor Direct Dial [phone]

## 2023-06-24 ENCOUNTER — Telehealth: Payer: Self-pay | Admitting: Physician Assistant

## 2023-06-24 NOTE — Transitions of Care (Post Inpatient/ED Visit) (Signed)
   06/24/2023  Name: Thomas Jennings MRN: [id number] DOB: [birth year]  Today's TOC FU Call Status: Today's TOC FU Call Status:: Unsuccessful Call (3rd Attempt) Unsuccessful Call (1st Attempt) Date: 06/15/23 Unsuccessful Call (2nd Attempt) Date: 06/16/23 Unsuccessful Call (3rd Attempt) Date: 06/24/23  Attempted to reach the patient regarding the most recent Inpatient/ED visit.  Follow Up Plan: No further outreach attempts will be made at this time. We have been unable to contact the patient.  Signature Karena Addison, LPN [facility] Nurse Health Advisor Direct Dial [phone]

## 2023-06-25 NOTE — Telephone Encounter (Signed)
test

## 2023-06-29 ENCOUNTER — Ambulatory Visit (INDEPENDENT_AMBULATORY_CARE_PROVIDER_SITE_OTHER): Payer: Self-pay | Admitting: Physician Assistant

## 2023-06-29 ENCOUNTER — Encounter: Payer: Self-pay | Admitting: Physician Assistant

## 2023-06-29 VITALS — BP 149/89 | HR 91 | Ht 73.0 in | Wt 242.0 lb

## 2023-06-29 DIAGNOSIS — S46012A Strain of muscle(s) and tendon(s) of the rotator cuff of left shoulder, initial encounter: Secondary | ICD-10-CM | POA: Insufficient documentation

## 2023-06-29 DIAGNOSIS — I10 Essential (primary) hypertension: Secondary | ICD-10-CM

## 2023-06-29 DIAGNOSIS — M25512 Pain in left shoulder: Secondary | ICD-10-CM | POA: Insufficient documentation

## 2023-06-29 DIAGNOSIS — F5101 Primary insomnia: Secondary | ICD-10-CM

## 2023-06-29 DIAGNOSIS — F1021 Alcohol dependence, in remission: Secondary | ICD-10-CM

## 2023-06-29 DIAGNOSIS — M4802 Spinal stenosis, cervical region: Secondary | ICD-10-CM

## 2023-06-29 DIAGNOSIS — S46012D Strain of muscle(s) and tendon(s) of the rotator cuff of left shoulder, subsequent encounter: Secondary | ICD-10-CM

## 2023-06-29 DIAGNOSIS — G5603 Carpal tunnel syndrome, bilateral upper limbs: Secondary | ICD-10-CM

## 2023-06-29 MED ORDER — LOSARTAN POTASSIUM 25 MG PO TABS: 25.0000 mg | ORAL_TABLET | Freq: Every day | ORAL | 0 refills | Status: DC

## 2023-06-29 MED ORDER — MELOXICAM 15 MG PO TABS
15.0000 mg | ORAL_TABLET | Freq: Every day | ORAL | 3 refills | Status: DC
Start: 2023-06-29 — End: 2023-10-20

## 2023-06-29 MED ORDER — DULOXETINE HCL 30 MG PO CPEP
30.0000 mg | ORAL_CAPSULE | Freq: Every day | ORAL | 1 refills | Status: DC
Start: 2023-06-29 — End: 2023-10-20

## 2023-06-29 MED ORDER — MIRTAZAPINE 7.5 MG PO TABS
7.5000 mg | ORAL_TABLET | Freq: Every day | ORAL | 1 refills | Status: DC
Start: 2023-06-29 — End: 2023-07-29

## 2023-06-29 MED ORDER — TRAMADOL HCL 50 MG PO TABS
50.0000 mg | ORAL_TABLET | Freq: Three times a day (TID) | ORAL | 0 refills | Status: DC
Start: 2023-06-29 — End: 2023-07-10
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
## 2023-06-30 ENCOUNTER — Encounter: Payer: Self-pay | Admitting: Physician Assistant

## 2023-06-30 DIAGNOSIS — F1021 Alcohol dependence, in remission: Secondary | ICD-10-CM | POA: Insufficient documentation

## 2023-07-10 ENCOUNTER — Other Ambulatory Visit: Payer: Self-pay | Admitting: Physician Assistant

## 2023-07-10 DIAGNOSIS — M25512 Pain in left shoulder: Secondary | ICD-10-CM

## 2023-07-10 DIAGNOSIS — S46012D Strain of muscle(s) and tendon(s) of the rotator cuff of left shoulder, subsequent encounter: Secondary | ICD-10-CM

## 2023-07-10 DIAGNOSIS — M4802 Spinal stenosis, cervical region: Secondary | ICD-10-CM

## 2023-07-10 NOTE — Telephone Encounter (Signed)
No concerns.  Sent one more 10 day supply but will need appt for more refills

## 2023-07-13 ENCOUNTER — Encounter: Payer: Self-pay | Admitting: Physician Assistant

## 2023-07-13 ENCOUNTER — Ambulatory Visit: Payer: Self-pay | Admitting: Physician Assistant

## 2023-07-15 ENCOUNTER — Encounter: Payer: Self-pay | Admitting: Physician Assistant

## 2023-07-15 ENCOUNTER — Ambulatory Visit (INDEPENDENT_AMBULATORY_CARE_PROVIDER_SITE_OTHER): Payer: MEDICAID | Admitting: Physician Assistant

## 2023-07-15 VITALS — BP 141/106 | HR 92 | Ht 73.0 in | Wt 241.0 lb

## 2023-07-15 DIAGNOSIS — S46012D Strain of muscle(s) and tendon(s) of the rotator cuff of left shoulder, subsequent encounter: Secondary | ICD-10-CM

## 2023-07-15 DIAGNOSIS — Z0289 Encounter for other administrative examinations: Secondary | ICD-10-CM

## 2023-07-15 DIAGNOSIS — M25512 Pain in left shoulder: Secondary | ICD-10-CM

## 2023-07-15 DIAGNOSIS — R635 Abnormal weight gain: Secondary | ICD-10-CM

## 2023-07-15 DIAGNOSIS — M4802 Spinal stenosis, cervical region: Secondary | ICD-10-CM

## 2023-07-15 DIAGNOSIS — I10 Essential (primary) hypertension: Secondary | ICD-10-CM | POA: Diagnosis not present

## 2023-07-15 DIAGNOSIS — F1021 Alcohol dependence, in remission: Secondary | ICD-10-CM

## 2023-07-15 DIAGNOSIS — K2100 Gastro-esophageal reflux disease with esophagitis, without bleeding: Secondary | ICD-10-CM | POA: Diagnosis not present

## 2023-07-15 MED ORDER — LOSARTAN POTASSIUM 25 MG PO TABS
25.0000 mg | ORAL_TABLET | Freq: Every day | ORAL | 0 refills | Status: DC
Start: 2023-07-15 — End: 2023-07-29

## 2023-07-15 MED ORDER — TRAMADOL HCL 50 MG PO TABS: 50.0000 mg | ORAL_TABLET | Freq: Three times a day (TID) | ORAL | 0 refills | Status: DC

## 2023-07-15 MED ORDER — OMEPRAZOLE 40 MG PO CPDR
40.0000 mg | DELAYED_RELEASE_CAPSULE | Freq: Two times a day (BID) | ORAL | 3 refills | Status: DC
Start: 2023-07-15 — End: 2024-09-09

## 2023-07-15 NOTE — Patient Instructions (Signed)
Thomas Jennings [phone]  Dr. Zollie Beckers- [phone]

## 2023-07-15 NOTE — Progress Notes (Signed)
Established Patient Office Visit  Subjective   Patient ID: Thomas Jennings, male    DOB: 03-21-1981  Age: 42 y.o. MRN: [id number]  Chief Complaint  Patient presents with   Medical Management of Chronic Issues    2 week fup on rotataor cuff     HPI Pt is a 42 yo male who presents to the clinic for medication follow up.   He continues to have problems getting all medications because of money and issues with travel. He wants to have the numbers of where he should follow up for his surgery and on his rotator cuff. He remains sober. Tramadol did help a lot and would like to continue it. He does not have his cozaar right now due to issues getting it filled. He is having a lot of reflux and needs medication for that.  .. Active Ambulatory Problems    Diagnosis Date Noted   Cervical spinal stenosis 03/09/2017   Cervical spondylosis without myelopathy 02/22/2013   Elevated blood pressure reading 03/09/2017   SOB (shortness of breath) 03/09/2017   GAD (generalized anxiety disorder) 03/09/2017   Insomnia 03/09/2017   Essential hypertension 05/11/2017   History of diverticulitis 08/05/2017   Atypical chest pain 08/05/2017   Gastroesophageal reflux disease with esophagitis 08/05/2017   Moderate episode of recurrent major depressive disorder (HCC) 08/05/2017   Carpal tunnel syndrome, bilateral 02/02/2018   Drug-induced erectile dysfunction 04/23/2018   Chronic pain syndrome 07/16/2018   Lipoma of torso 10/21/2018   Blood in stool 10/21/2018   Pancreatic mass 06/29/2019   Lower abdominal pain 06/29/2019   Left lateral epicondylitis 08/09/2019   Elevated ALT measurement 10/16/2020   Poor concentration 03/18/2021   Bilateral hearing loss 03/18/2021   Insomnia due to mental disorder 08/01/2021   Suspected COVID-19 virus infection 12/30/2021   Stuttering 12/30/2021   History of URI (upper respiratory infection) 12/30/2021   Chronic cough 03/18/2022   Vitamin D insufficiency 03/19/2022    Elevated hemoglobin (HCC) 03/19/2022   Hypertriglyceridemia 03/19/2022   Right hip pain 03/20/2022   Alcohol abuse 11/10/2022   Traumatic tear of left rotator cuff 06/29/2023   Acute pain of left shoulder 06/29/2023   Alcoholism in remission (HCC) 06/30/2023   Abnormal weight gain 07/15/2023   Pain management contract agreement 07/17/2023   Resolved Ambulatory Problems    Diagnosis Date Noted   No Resolved Ambulatory Problems   Past Medical History:  Diagnosis Date   Cervical spondylosis with myelopathy and radiculopathy 03/09/2017   Primary insomnia 03/09/2017     Review of Systems  All other systems reviewed and are negative.     Objective:     BP (!) 141/106   Pulse 92   Ht 6\' 1"  (1.854 m)   Wt 241 lb (109.3 kg)   SpO2 99%   BMI 31.80 kg/m  BP Readings from Last 3 Encounters:  07/15/23 (!) 141/106  06/29/23 (!) 149/89  03/16/23 (!) 141/92   Wt Readings from Last 3 Encounters:  07/15/23 241 lb (109.3 kg)  06/29/23 242 lb (109.8 kg)  03/16/23 237 lb (107.5 kg)      Physical Exam Constitutional:      Appearance: Normal appearance. He is obese.  HENT:     Head: Normocephalic.  Cardiovascular:     Rate and Rhythm: Normal rate and regular rhythm.     Pulses: Normal pulses.  Pulmonary:     Effort: Pulmonary effort is normal.     Breath sounds: Normal breath sounds.  Musculoskeletal:     Right lower leg: No edema.     Left lower leg: No edema.     Comments: Left shoulder pain and decreased ROM.   Neurological:     General: No focal deficit present.     Mental Status: He is alert and oriented to person, place, and time.  Psychiatric:        Mood and Affect: Mood normal.       The 10-year ASCVD risk score (Arnett DK, et al., 2019) is: 1.3%    Assessment & Plan:  [name] KitchenMarland KitchenXaviar was seen today for medical management of chronic issues.  Diagnoses and all orders for this visit:  Essential hypertension -     losartan (COZAAR) 25 MG tablet; Take 1 tablet (25  mg total) by mouth daily.  Abnormal weight gain  Gastroesophageal reflux disease with esophagitis, unspecified whether hemorrhage -     omeprazole (PRILOSEC) 40 MG capsule; Take 1 capsule (40 mg total) by mouth in the morning and at bedtime.  Traumatic tear of left rotator cuff, unspecified tear extent, subsequent encounter -     traMADol (ULTRAM) 50 MG tablet; Take 1 tablet (50 mg total) by mouth every 8 (eight) hours.  Acute pain of left shoulder -     traMADol (ULTRAM) 50 MG tablet; Take 1 tablet (50 mg total) by mouth every 8 (eight) hours.  Cervical spinal stenosis -     traMADol (ULTRAM) 50 MG tablet; Take 1 tablet (50 mg total) by mouth every 8 (eight) hours.  Pain management contract agreement -     Drug Screen 12+Alcohol+CRT, Ur  Alcoholism in remission (HCC)   BP not to goal, explained risk of not controlling BP with patient Start cozaar, recheck BP in 2 weeks  Continues to be sober and no concerns with filling rx .[name] KitchenPDMP reviewed during this encounter. Refilled tramadol Filled out pain contract UTD appt 3 month follow up  Pt does need to follow up with neurosurgery and orthopedics Names and numbers given to patient to follow up with   Omeprazole to start for GERD  Return in about 2 weeks (around 07/29/2023), or if symptoms worsen or fail to improve, for recheck BP/3 months in office.    Thomas Gaw, PA-C

## 2023-07-17 ENCOUNTER — Encounter: Payer: Self-pay | Admitting: Physician Assistant

## 2023-07-17 DIAGNOSIS — Z0289 Encounter for other administrative examinations: Secondary | ICD-10-CM | POA: Insufficient documentation

## 2023-07-19 LAB — DRUG SCREEN 12+ALCOHOL+CRT, UR
Amphetamines, Urine: NEGATIVE ng/mL
BENZODIAZ UR QL: NEGATIVE ng/mL
Barbiturate: NEGATIVE ng/mL
Cannabinoids: NEGATIVE ng/mL
Cocaine (Metabolite): NEGATIVE ng/mL
Creatinine, Urine: 35.6 mg/dL (ref 20.0–300.0)
Ethanol, Urine: NEGATIVE %
Meperidine: NEGATIVE ng/mL
Methadone: NEGATIVE ng/mL
OPIATE SCREEN URINE: NEGATIVE ng/mL
Oxycodone/Oxymorphone, Urine: NEGATIVE ng/mL
Phencyclidine: NEGATIVE ng/mL
Propoxyphene: NEGATIVE ng/mL
Tramadol: POSITIVE — AB

## 2023-07-20 NOTE — Progress Notes (Signed)
No concerns.

## 2023-07-29 ENCOUNTER — Encounter: Payer: Self-pay | Admitting: Physician Assistant

## 2023-07-29 ENCOUNTER — Ambulatory Visit: Payer: MEDICAID

## 2023-07-29 ENCOUNTER — Ambulatory Visit (INDEPENDENT_AMBULATORY_CARE_PROVIDER_SITE_OTHER): Payer: MEDICAID | Admitting: Physician Assistant

## 2023-07-29 VITALS — BP 142/88 | HR 86 | Ht 73.0 in | Wt 241.0 lb

## 2023-07-29 DIAGNOSIS — B354 Tinea corporis: Secondary | ICD-10-CM

## 2023-07-29 DIAGNOSIS — I10 Essential (primary) hypertension: Secondary | ICD-10-CM

## 2023-07-29 DIAGNOSIS — F5101 Primary insomnia: Secondary | ICD-10-CM | POA: Diagnosis not present

## 2023-07-29 DIAGNOSIS — R112 Nausea with vomiting, unspecified: Secondary | ICD-10-CM

## 2023-07-29 DIAGNOSIS — F1011 Alcohol abuse, in remission: Secondary | ICD-10-CM

## 2023-07-29 DIAGNOSIS — K2100 Gastro-esophageal reflux disease with esophagitis, without bleeding: Secondary | ICD-10-CM | POA: Diagnosis not present

## 2023-07-29 DIAGNOSIS — R1314 Dysphagia, pharyngoesophageal phase: Secondary | ICD-10-CM

## 2023-07-29 MED ORDER — FAMOTIDINE 20 MG PO TABS
20.0000 mg | ORAL_TABLET | Freq: Two times a day (BID) | ORAL | 1 refills | Status: DC
Start: 2023-07-29 — End: 2023-10-20

## 2023-07-29 MED ORDER — LOSARTAN POTASSIUM 50 MG PO TABS
50.0000 mg | ORAL_TABLET | Freq: Every day | ORAL | 0 refills | Status: DC
Start: 2023-07-29 — End: 2023-10-20

## 2023-07-29 MED ORDER — CLOTRIMAZOLE-BETAMETHASONE 1-0.05 % EX CREA
1.0000 | TOPICAL_CREAM | Freq: Every day | CUTANEOUS | 0 refills | Status: DC
Start: 2023-07-29 — End: 2023-10-20

## 2023-07-29 MED ORDER — MIRTAZAPINE 7.5 MG PO TABS
7.5000 mg | ORAL_TABLET | Freq: Every day | ORAL | 0 refills | Status: DC
Start: 2023-07-29 — End: 2023-10-20

## 2023-07-29 NOTE — Progress Notes (Unsigned)
Established Patient Office Visit  Subjective   Patient ID: Thomas Jennings, male    DOB: 04-02-1981  Age: 42 y.o. MRN: [id number]  No chief complaint on file.   HPI  Patient is a 42 year old male in today for HTN, GERD, nausea, vomiting, and new lesion on right arm.   Patient states that he's been struggling with GERD since 2018 but has had an increase in symptoms over the past 1-2 months. Patient complains of daily nausea, projectile vomiting, painful with swallowing, trouble swallowing, mild hematemesis, abdominal fullness and bloating, and epigastric pain. Patient denies any changes in his stool color or consistency and denies hematochezia. Patient has been taking omeprazole once daily and feels this does provide some relief. He states that he only eats one meal a day due to decreased hunger and abdominal fullness.   Pt does have history of alcohol abuse but has been sober since 03/19/2023.   Patient also complains of lesion on his proximal right forearm. Lesion is circular, erythematous, and well-circumscribed. Lesion is raised around its edges and has mild central clearing. Patient reports that it is itchy and wants a cream to treat.   BP is elevated in office today and patient reports medication adherence but states he is not sleeping well or eating regularly right now due to his increase in GERD symptoms.    .. Active Ambulatory Problems    Diagnosis Date Noted   Cervical spinal stenosis 03/09/2017   Cervical spondylosis without myelopathy 02/22/2013   Elevated blood pressure reading 03/09/2017   SOB (shortness of breath) 03/09/2017   GAD (generalized anxiety disorder) 03/09/2017   Insomnia 03/09/2017   Essential hypertension 05/11/2017   History of diverticulitis 08/05/2017   Atypical chest pain 08/05/2017   Gastroesophageal reflux disease with esophagitis 08/05/2017   Moderate episode of recurrent major depressive disorder (HCC) 08/05/2017   Carpal tunnel syndrome,  bilateral 02/02/2018   Drug-induced erectile dysfunction 04/23/2018   Chronic pain syndrome 07/16/2018   Lipoma of torso 10/21/2018   Blood in stool 10/21/2018   Pancreatic mass 06/29/2019   Lower abdominal pain 06/29/2019   Left lateral epicondylitis 08/09/2019   Elevated ALT measurement 10/16/2020   Poor concentration 03/18/2021   Bilateral hearing loss 03/18/2021   Insomnia due to mental disorder 08/01/2021   Suspected COVID-19 virus infection 12/30/2021   Stuttering 12/30/2021   History of URI (upper respiratory infection) 12/30/2021   Chronic cough 03/18/2022   Vitamin D insufficiency 03/19/2022   Elevated hemoglobin (HCC) 03/19/2022   Hypertriglyceridemia 03/19/2022   Right hip pain 03/20/2022   Alcohol abuse 11/10/2022   Traumatic tear of left rotator cuff 06/29/2023   Acute pain of left shoulder 06/29/2023   Alcoholism in remission (HCC) 06/30/2023   Abnormal weight gain 07/15/2023   Pain management contract agreement 07/17/2023   Nausea and vomiting 07/30/2023   Pharyngoesophageal dysphagia 07/30/2023   Alcohol abuse, in remission 07/30/2023   Ringworm of body 07/30/2023   Resolved Ambulatory Problems    Diagnosis Date Noted   No Resolved Ambulatory Problems   Past Medical History:  Diagnosis Date   Cervical spondylosis with myelopathy and radiculopathy 03/09/2017   Primary insomnia 03/09/2017    ROS See HPI.    Objective:     BP (!) 142/88 (BP Location: Left Arm)   Pulse 86   Ht 6\' 1"  (1.854 m)   Wt 241 lb (109.3 kg)   SpO2 98%   BMI 31.80 kg/m  BP Readings from Last 3 Encounters:  07/29/23 (!) 142/88  07/15/23 (!) 141/106  06/29/23 (!) 149/89   Wt Readings from Last 3 Encounters:  07/29/23 241 lb (109.3 kg)  07/15/23 241 lb (109.3 kg)  06/29/23 242 lb (109.8 kg)      Physical Exam Constitutional:      Appearance: Normal appearance.  HENT:     Head: Normocephalic.  Cardiovascular:     Rate and Rhythm: Normal rate and regular rhythm.   Pulmonary:     Effort: Pulmonary effort is normal.     Breath sounds: Normal breath sounds.  Abdominal:     General: Bowel sounds are normal. There is no distension.     Palpations: Abdomen is soft. There is no mass.     Tenderness: There is abdominal tenderness. There is no right CVA tenderness, left CVA tenderness, guarding or rebound.     Hernia: No hernia is present.     Comments: Epigastric tenderness to palpation  Skin:    Comments: Dime size erythematous lesion with raised borders and fine scales centrally with some clearing.   Neurological:     General: No focal deficit present.     Mental Status: He is oriented to person, place, and time.  Psychiatric:        Mood and Affect: Mood normal.       The 10-year ASCVD risk score (Arnett DK, et al., 2019) is: 1.4%    Assessment & Plan:  [name] KitchenMarland KitchenDiagnoses and all orders for this visit:  Nausea and vomiting, unspecified vomiting type -     Lipase -     CBC w/Diff/Platelet -     CMP14+EGFR -     famotidine (PEPCID) 20 MG tablet; Take 1 tablet (20 mg total) by mouth 2 (two) times daily. -     Ambulatory referral to Gastroenterology  Essential hypertension -     Lipase -     CBC w/Diff/Platelet -     CMP14+EGFR -     losartan (COZAAR) 50 MG tablet; Take 1 tablet (50 mg total) by mouth daily.  Gastroesophageal reflux disease with esophagitis, unspecified whether hemorrhage -     Lipase -     CBC w/Diff/Platelet -     CMP14+EGFR -     famotidine (PEPCID) 20 MG tablet; Take 1 tablet (20 mg total) by mouth 2 (two) times daily. -     Ambulatory referral to Gastroenterology  Primary insomnia -     mirtazapine (REMERON) 7.5 MG tablet; Take 1 tablet (7.5 mg total) by mouth at bedtime.  Pharyngoesophageal dysphagia -     Lipase -     CBC w/Diff/Platelet -     CMP14+EGFR -     famotidine (PEPCID) 20 MG tablet; Take 1 tablet (20 mg total) by mouth 2 (two) times daily. -     Ambulatory referral to Gastroenterology  Alcohol  abuse, in remission -     Ambulatory referral to Gastroenterology  Ringworm of body -     clotrimazole-betamethasone (LOTRISONE) cream; Apply 1 Application topically daily.   Gave GI cocktail in office today for symptomatic relief of GERD symptoms Ordered labs today to check for pancreatitis ? Hital hernia or esophageal stricture or h.pylori Increase omeprazole to twice daily for increase in GERD symptoms Add pepcid 20 mg twice daily to further relieve GERD symptoms  Will send referral to GI due to increase in nausea, vomiting, abdominal discomfort, and pain/difficulty with swallowing to consider endoscopy Increased cozaar to 50 mg for better BP control Will  send topical antifungal cream for likely tinea corporis on right proximal forearm Follow up in four weeks BP recheck and to reassess GERD symptoms  Return in about 4 weeks (around 08/26/2023).    Tandy Gaw, PA-C

## 2023-07-29 NOTE — Patient Instructions (Addendum)
Increase omeprazole to twice a day Add pepcid 20mg  twice a day Will make referral to GI Increase cozaar to 50mg  for BP   Hiatal Hernia  A hiatal hernia occurs when part of the stomach slides above the muscle that separates the abdomen from the chest (diaphragm). A person can be born with a hiatal hernia (congenital), or it may develop over time. In almost all cases of hiatal hernia, only the top part of the stomach pushes through the diaphragm. Many people have a hiatal hernia with no symptoms. The larger the hernia, the more likely it is that you will have symptoms. In some cases, a hiatal hernia allows stomach acid to flow back into the tube that carries food from your mouth to your stomach (esophagus). This may cause heartburn symptoms. The development of heartburn symptoms may mean that you have a condition called gastroesophageal reflux disease (GERD). What are the causes? This condition is caused by a weakness in the opening (hiatus) where the esophagus passes through the diaphragm to attach to the upper part of the stomach. A person may be born with a weakness in the hiatus, or a weakness can develop over time. What increases the risk? This condition is more likely to develop in: Older people. Age is a major risk factor for a hiatal hernia, especially if you are over the age of 72. Pregnant women. People who are overweight. People who have frequent constipation. What are the signs or symptoms? Symptoms of this condition usually develop in the form of GERD symptoms. Symptoms include: Heartburn. Upset stomach (indigestion). Trouble swallowing. Coughing or wheezing. Wheezing is making high-pitched whistling sounds when you breathe. Sore throat. Chest pain. Nausea and vomiting. How is this diagnosed? This condition may be diagnosed during testing for GERD. Tests that may be done include: X-rays of your stomach or chest. An upper gastrointestinal (GI) series. This is an X-ray exam of  your GI tract that is taken after you swallow a chalky liquid that shows up clearly on the X-ray. Endoscopy. This is a procedure to look into your stomach using a thin, flexible tube that has a tiny camera and light on the end of it. How is this treated? This condition may be treated by: Dietary and lifestyle changes to help reduce GERD symptoms. Medicines. These may include: Over-the-counter antacids. Medicines that make your stomach empty more quickly. Medicines that block the production of stomach acid (H2 blockers). Stronger medicines to reduce stomach acid (proton pump inhibitors). Surgery to repair the hernia, if other treatments are not helping. If you have no symptoms, you may not need treatment. Follow these instructions at home: Lifestyle and activity Do not use any products that contain nicotine or tobacco. These products include cigarettes, chewing tobacco, and vaping devices, such as e-cigarettes. If you need help quitting, ask your health care provider. Try to achieve and maintain a healthy body weight. Avoid putting pressure on your abdomen. Anything that puts pressure on your abdomen increases the amount of acid that may be pushed up into your esophagus. Avoid bending over, especially after eating. Raise the head of your bed by putting blocks under the legs. This keeps your head and esophagus higher than your stomach. Do not wear tight clothing around your chest or stomach. Try not to strain when having a bowel movement, when urinating, or when lifting heavy objects. Eating and drinking Avoid foods that can worsen GERD symptoms. These may include: Fatty foods, like fried foods. Citrus fruits, like oranges or lemon.  Other foods and drinks that contain acid, like orange juice or tomatoes. Spicy food. Chocolate. Eat frequent small meals instead of three large meals a day. This helps prevent your stomach from getting too full. Eat slowly. Do not lie down right after  eating. Do not eat 1-2 hours before bed. Do not drink beverages with caffeine. These include cola, coffee, cocoa, and tea. Do not drink alcohol. General instructions Take over-the-counter and prescription medicines only as told by your health care provider. Keep all follow-up visits. Your health care provider will want to check that any new prescribed medicines are helping your symptoms. Contact a health care provider if: Your symptoms are not controlled with medicines or lifestyle changes. You are having trouble swallowing. You have coughing or wheezing that will not go away. Your pain is getting worse. Your pain spreads to your arms, neck, jaw, teeth, or back. You feel nauseous or you vomit. Get help right away if: You have shortness of breath. You vomit blood. You have bright red blood in your stools. You have black, tarry stools. These symptoms may be an emergency. Get help right away. Call 911. Do not wait to see if the symptoms will go away. Do not drive yourself to the hospital. Summary A hiatal hernia occurs when part of the stomach slides above the muscle that separates the abdomen from the chest. A person may be born with a weakness in the hiatus, or a weakness can develop over time. Symptoms of a hiatal hernia may include heartburn, trouble swallowing, or sore throat. Management of a hiatal hernia includes eating frequent small meals instead of three large meals a day. Get help right away if you vomit blood, have bright red blood in your stools, or have black, tarry stools. This information is not intended to replace advice given to you by your health care provider. Make sure you discuss any questions you have with your health care provider. Document Revised: 12/17/2021 Document Reviewed: 12/17/2021 Elsevier Patient Education  2024 [employer].

## 2023-07-30 ENCOUNTER — Encounter: Payer: Self-pay | Admitting: Physician Assistant

## 2023-07-30 DIAGNOSIS — B354 Tinea corporis: Secondary | ICD-10-CM | POA: Insufficient documentation

## 2023-07-30 DIAGNOSIS — R1314 Dysphagia, pharyngoesophageal phase: Secondary | ICD-10-CM | POA: Insufficient documentation

## 2023-07-30 DIAGNOSIS — F1011 Alcohol abuse, in remission: Secondary | ICD-10-CM | POA: Insufficient documentation

## 2023-07-30 DIAGNOSIS — R112 Nausea with vomiting, unspecified: Secondary | ICD-10-CM | POA: Insufficient documentation

## 2023-07-30 LAB — CBC WITH DIFFERENTIAL/PLATELET
Basophils Absolute: 0.1 10*3/uL (ref 0.0–0.2)
Basos: 1 %
EOS (ABSOLUTE): 0.1 10*3/uL (ref 0.0–0.4)
Eos: 2 %
Hematocrit: 48 % (ref 37.5–51.0)
Hemoglobin: 15.9 g/dL (ref 13.0–17.7)
Immature Grans (Abs): 0.1 10*3/uL (ref 0.0–0.1)
Immature Granulocytes: 1 %
Lymphocytes Absolute: 1.2 10*3/uL (ref 0.7–3.1)
Lymphs: 25 %
MCH: 28.1 pg (ref 26.6–33.0)
MCHC: 33.1 g/dL (ref 31.5–35.7)
MCV: 85 fL (ref 79–97)
Monocytes Absolute: 0.5 10*3/uL (ref 0.1–0.9)
Monocytes: 10 %
Neutrophils Absolute: 2.9 10*3/uL (ref 1.4–7.0)
Neutrophils: 61 %
Platelets: 229 10*3/uL (ref 150–450)
RBC: 5.65 x10E6/uL (ref 4.14–5.80)
RDW: 14.8 % (ref 11.6–15.4)
WBC: 4.7 10*3/uL (ref 3.4–10.8)

## 2023-07-30 LAB — CMP14+EGFR
ALT: 60 IU/L — ABNORMAL HIGH (ref 0–44)
AST: 57 IU/L — ABNORMAL HIGH (ref 0–40)
Albumin: 4.8 g/dL (ref 4.1–5.1)
Alkaline Phosphatase: 74 IU/L (ref 44–121)
BUN/Creatinine Ratio: 17 (ref 9–20)
BUN: 12 mg/dL (ref 6–24)
Bilirubin Total: 0.6 mg/dL (ref 0.0–1.2)
CO2: 19 mmol/L — ABNORMAL LOW (ref 20–29)
Calcium: 9.4 mg/dL (ref 8.7–10.2)
Chloride: 103 mmol/L (ref 96–106)
Creatinine, Ser: 0.71 mg/dL — ABNORMAL LOW (ref 0.76–1.27)
Globulin, Total: 2.4 g/dL (ref 1.5–4.5)
Glucose: 86 mg/dL (ref 70–99)
Potassium: 4.4 mmol/L (ref 3.5–5.2)
Sodium: 143 mmol/L (ref 134–144)
Total Protein: 7.2 g/dL (ref 6.0–8.5)
eGFR: 116 mL/min/{1.73_m2} (ref >59)

## 2023-07-30 LAB — LIPASE: Lipase: 19 U/L (ref 13–78)

## 2023-07-30 NOTE — Progress Notes (Signed)
[place],   Lipase normal, no signs of pancreatitis.  Normal hemoglobin no signs of blood loss.  Liver enzymes are elevated but improving! Kidney function looks great.

## 2023-08-17 ENCOUNTER — Other Ambulatory Visit: Payer: Self-pay | Admitting: Physician Assistant

## 2023-08-17 DIAGNOSIS — M25512 Pain in left shoulder: Secondary | ICD-10-CM

## 2023-08-17 DIAGNOSIS — M4802 Spinal stenosis, cervical region: Secondary | ICD-10-CM

## 2023-08-17 DIAGNOSIS — S46012D Strain of muscle(s) and tendon(s) of the rotator cuff of left shoulder, subsequent encounter: Secondary | ICD-10-CM

## 2023-08-19 NOTE — Telephone Encounter (Signed)
..  PDMP reviewed during this encounter. Needs follow up appt Sent for one month

## 2023-08-26 ENCOUNTER — Ambulatory Visit: Payer: MEDICAID | Admitting: Physician Assistant

## 2023-08-26 DIAGNOSIS — I10 Essential (primary) hypertension: Secondary | ICD-10-CM

## 2023-09-16 ENCOUNTER — Other Ambulatory Visit: Payer: Self-pay | Admitting: Physician Assistant

## 2023-09-16 DIAGNOSIS — S46012D Strain of muscle(s) and tendon(s) of the rotator cuff of left shoulder, subsequent encounter: Secondary | ICD-10-CM

## 2023-09-16 DIAGNOSIS — M25512 Pain in left shoulder: Secondary | ICD-10-CM

## 2023-09-16 DIAGNOSIS — M4802 Spinal stenosis, cervical region: Secondary | ICD-10-CM

## 2023-10-15 ENCOUNTER — Other Ambulatory Visit: Payer: Self-pay | Admitting: Physician Assistant

## 2023-10-15 DIAGNOSIS — M25512 Pain in left shoulder: Secondary | ICD-10-CM

## 2023-10-15 DIAGNOSIS — S46012D Strain of muscle(s) and tendon(s) of the rotator cuff of left shoulder, subsequent encounter: Secondary | ICD-10-CM

## 2023-10-15 DIAGNOSIS — M4802 Spinal stenosis, cervical region: Secondary | ICD-10-CM

## 2023-10-20 ENCOUNTER — Telehealth: Payer: Self-pay

## 2023-10-20 ENCOUNTER — Ambulatory Visit (INDEPENDENT_AMBULATORY_CARE_PROVIDER_SITE_OTHER): Payer: MEDICAID | Admitting: Physician Assistant

## 2023-10-20 ENCOUNTER — Encounter: Payer: Self-pay | Admitting: Physician Assistant

## 2023-10-20 VITALS — BP 164/112 | HR 95 | Resp 14 | Ht 73.0 in | Wt 246.6 lb

## 2023-10-20 DIAGNOSIS — F1011 Alcohol abuse, in remission: Secondary | ICD-10-CM

## 2023-10-20 DIAGNOSIS — S46012D Strain of muscle(s) and tendon(s) of the rotator cuff of left shoulder, subsequent encounter: Secondary | ICD-10-CM

## 2023-10-20 DIAGNOSIS — F5101 Primary insomnia: Secondary | ICD-10-CM

## 2023-10-20 DIAGNOSIS — M47812 Spondylosis without myelopathy or radiculopathy, cervical region: Secondary | ICD-10-CM

## 2023-10-20 DIAGNOSIS — M4802 Spinal stenosis, cervical region: Secondary | ICD-10-CM | POA: Diagnosis not present

## 2023-10-20 DIAGNOSIS — Z0289 Encounter for other administrative examinations: Secondary | ICD-10-CM

## 2023-10-20 DIAGNOSIS — F411 Generalized anxiety disorder: Secondary | ICD-10-CM

## 2023-10-20 DIAGNOSIS — I10 Essential (primary) hypertension: Secondary | ICD-10-CM | POA: Diagnosis not present

## 2023-10-20 DIAGNOSIS — F331 Major depressive disorder, recurrent, moderate: Secondary | ICD-10-CM

## 2023-10-20 MED ORDER — TRAMADOL HCL 50 MG PO TABS: 50.0000 mg | ORAL_TABLET | Freq: Three times a day (TID) | ORAL | 0 refills | Status: DC

## 2023-10-20 MED ORDER — MIRTAZAPINE 7.5 MG PO TABS: 7.5000 mg | ORAL_TABLET | Freq: Every day | ORAL | 0 refills | Status: DC

## 2023-10-20 MED ORDER — LOSARTAN POTASSIUM 100 MG PO TABS: 100.0000 mg | ORAL_TABLET | Freq: Every day | ORAL | 0 refills | Status: DC

## 2023-10-20 MED ORDER — PREGABALIN 100 MG PO CAPS: 100.0000 mg | ORAL_CAPSULE | Freq: Two times a day (BID) | ORAL | 0 refills | Status: DC

## 2023-10-20 MED ORDER — MELOXICAM 15 MG PO TABS: 15.0000 mg | ORAL_TABLET | Freq: Every day | ORAL | 0 refills | Status: DC

## 2023-10-20 MED ORDER — DULOXETINE HCL 60 MG PO CPEP: 60.0000 mg | ORAL_CAPSULE | Freq: Every day | ORAL | 0 refills | Status: DC

## 2023-10-20 NOTE — Progress Notes (Addendum)
Established Patient Office Visit  Subjective   Patient ID: Thomas Jennings, male    DOB: [birth year]  Age: 42 y.o. MRN: [id number]  Chief Complaint  Patient presents with   test to discuss    HPI Pt is a 42 yo male who presents to the clinic for medication refills.   Pt is not checking BP at home. He is taking cozaar daily. Denies any CP, palpitations, headaches, vision changes.   He continues to be in chronic upper back cervical neck pain with radiation into arms and hands. He had EMG recently and discussed having another MRI. Per patient states the neurologist thinks pain could be coming from C6, C7. He is taking tramadol TID, cymbalta, lyrica, mobic for pain control.   .. Active Ambulatory Problems    Diagnosis Date Noted   Cervical spinal stenosis 03/09/2017   Cervical spondylosis without myelopathy 02/22/2013   Elevated blood pressure reading 03/09/2017   SOB (shortness of breath) 03/09/2017   GAD (generalized anxiety disorder) 03/09/2017   Insomnia 03/09/2017   Essential hypertension 05/11/2017   History of diverticulitis 08/05/2017   Atypical chest pain 08/05/2017   Gastroesophageal reflux disease with esophagitis 08/05/2017   Moderate episode of recurrent major depressive disorder (HCC) 08/05/2017   Carpal tunnel syndrome, bilateral 02/02/2018   Drug-induced erectile dysfunction 04/23/2018   Chronic pain syndrome 07/16/2018   Lipoma of torso 10/21/2018   Blood in stool 10/21/2018   Pancreatic mass 06/29/2019   Lower abdominal pain 06/29/2019   Left lateral epicondylitis 08/09/2019   Elevated ALT measurement 10/16/2020   Poor concentration 03/18/2021   Bilateral hearing loss 03/18/2021   Insomnia due to mental disorder 08/01/2021   Suspected COVID-19 virus infection 12/30/2021   Stuttering 12/30/2021   History of URI (upper respiratory infection) 12/30/2021   Chronic cough 03/18/2022   Vitamin D insufficiency 03/19/2022   Elevated hemoglobin (HCC) 03/19/2022    Hypertriglyceridemia 03/19/2022   Right hip pain 03/20/2022   Alcohol abuse 11/10/2022   Traumatic tear of left rotator cuff 06/29/2023   Acute pain of left shoulder 06/29/2023   Alcoholism in remission (HCC) 06/30/2023   Abnormal weight gain 07/15/2023   Pain management contract agreement 07/17/2023   Nausea and vomiting 07/30/2023   Pharyngoesophageal dysphagia 07/30/2023   Alcohol abuse, in remission 07/30/2023   Ringworm of body 07/30/2023   Resolved Ambulatory Problems    Diagnosis Date Noted   No Resolved Ambulatory Problems   Past Medical History:  Diagnosis Date   Cervical spondylosis with myelopathy and radiculopathy 03/09/2017   Primary insomnia 03/09/2017    ROS See HPI.    Objective:     BP (!) 164/112 (BP Location: Left Arm, Patient Position: Sitting)   Pulse 95   Resp 14   Ht 6\' 1"  (1.854 m)   Wt 246 lb 9.6 oz (111.9 kg)   SpO2 95%   BMI 32.53 kg/m  BP Readings from Last 3 Encounters:  10/20/23 (!) 164/112  07/29/23 (!) 142/88  07/15/23 (!) 141/106   Wt Readings from Last 3 Encounters:  10/20/23 246 lb 9.6 oz (111.9 kg)  07/29/23 241 lb (109.3 kg)  07/15/23 241 lb (109.3 kg)      Physical Exam Constitutional:      Appearance: Normal appearance. He is obese.  Cardiovascular:     Rate and Rhythm: Normal rate.  Pulmonary:     Effort: Pulmonary effort is normal.  Neurological:     General: No focal deficit present.  Mental Status: He is alert.  Psychiatric:        Mood and Affect: Mood normal.      The 10-year ASCVD risk score (Arnett DK, et al., 2019) is: 1.8%    Assessment & Plan:  [name] KitchenMarland KitchenBrianna was seen today for test to discuss.  Diagnoses and all orders for this visit:  Essential hypertension -     losartan (COZAAR) 100 MG tablet; Take 1 tablet (100 mg total) by mouth daily.  Traumatic tear of left rotator cuff, unspecified tear extent, subsequent encounter -     traMADol (ULTRAM) 50 MG tablet; Take 1 tablet (50 mg total) by mouth  every 8 (eight) hours. -     meloxicam (MOBIC) 15 MG tablet; Take 1 tablet (15 mg total) by mouth daily. -     DULoxetine (CYMBALTA) 60 MG capsule; Take 1 capsule (60 mg total) by mouth daily.  Cervical spinal stenosis -     traMADol (ULTRAM) 50 MG tablet; Take 1 tablet (50 mg total) by mouth every 8 (eight) hours. -     meloxicam (MOBIC) 15 MG tablet; Take 1 tablet (15 mg total) by mouth daily. -     DULoxetine (CYMBALTA) 60 MG capsule; Take 1 capsule (60 mg total) by mouth daily. -     pregabalin (LYRICA) 100 MG capsule; Take 1 capsule (100 mg total) by mouth 2 (two) times daily.  Pain management contract agreement -     traMADol (ULTRAM) 50 MG tablet; Take 1 tablet (50 mg total) by mouth every 8 (eight) hours. -     DULoxetine (CYMBALTA) 60 MG capsule; Take 1 capsule (60 mg total) by mouth daily. -     pregabalin (LYRICA) 100 MG capsule; Take 1 capsule (100 mg total) by mouth 2 (two) times daily.  Cervical spondylosis without myelopathy -     traMADol (ULTRAM) 50 MG tablet; Take 1 tablet (50 mg total) by mouth every 8 (eight) hours. -     DULoxetine (CYMBALTA) 60 MG capsule; Take 1 capsule (60 mg total) by mouth daily. -     pregabalin (LYRICA) 100 MG capsule; Take 1 capsule (100 mg total) by mouth 2 (two) times daily.  Moderate episode of recurrent major depressive disorder (HCC) -     DULoxetine (CYMBALTA) 60 MG capsule; Take 1 capsule (60 mg total) by mouth daily.  GAD (generalized anxiety disorder) -     DULoxetine (CYMBALTA) 60 MG capsule; Take 1 capsule (60 mg total) by mouth daily.  Primary insomnia -     mirtazapine (REMERON) 7.5 MG tablet; Take 1 tablet (7.5 mg total) by mouth at bedtime.   BP not to goal Increased to 100mg  of cozaar Low salt diet Nurse visit in 1 month  Continue to work with orthopedics for surgery recommendations Follow up in January with surgeon who completed spinal fusion Increased lyrica to 100mg  bid Increased cymbalta to 60mg  Tramadol refilled  for TID Pain contract UTD .[name] KitchenPDMP reviewed during this encounter. Continue mobic Follow up in 3 months  Pt has been sober from alcohol for almost 6 months.     Return in about 3 months (around 01/18/2024) for 3 month OV/1 month BP nurse recheck.    Tandy Gaw, PA-C

## 2023-10-20 NOTE — Telephone Encounter (Signed)
Copied from CRM [id number]. Topic: Clinical - Prescription Issue >> Oct 19, 2023 12:08 PM Thomas Jennings wrote: Reason for CRM: Patient calling on the status of getting the refill for the traMADol (ULTRAM) 50 MG tablet , patient reached out to [facility] pharmacy and he was told to contact his provider . Patient need a refill and is out of the medication

## 2023-10-20 NOTE — Telephone Encounter (Signed)
Tramadol refill sent to pharmacy today

## 2023-10-20 NOTE — Patient Instructions (Signed)
Increase cozaar to 100mg  for blood pressure Increased lyrica 100mg  twice a day Increased cymbalta 60mg   Refilled mobic Tramadol three times a day

## 2023-10-30 ENCOUNTER — Ambulatory Visit: Payer: MEDICAID | Admitting: Physician Assistant

## 2023-11-20 ENCOUNTER — Ambulatory Visit: Payer: MEDICAID

## 2024-01-14 ENCOUNTER — Other Ambulatory Visit: Payer: Self-pay | Admitting: Physician Assistant

## 2024-01-14 DIAGNOSIS — M4802 Spinal stenosis, cervical region: Secondary | ICD-10-CM

## 2024-01-14 DIAGNOSIS — S46012D Strain of muscle(s) and tendon(s) of the rotator cuff of left shoulder, subsequent encounter: Secondary | ICD-10-CM

## 2024-01-14 DIAGNOSIS — Z0289 Encounter for other administrative examinations: Secondary | ICD-10-CM

## 2024-01-14 DIAGNOSIS — M47812 Spondylosis without myelopathy or radiculopathy, cervical region: Secondary | ICD-10-CM

## 2024-01-15 NOTE — Telephone Encounter (Signed)
..  PDMP reviewed during this encounter. No concerns.

## 2024-01-18 ENCOUNTER — Encounter: Payer: Self-pay | Admitting: Physician Assistant

## 2024-01-18 ENCOUNTER — Ambulatory Visit: Payer: MEDICAID | Admitting: Physician Assistant

## 2024-01-18 VITALS — BP 141/99 | HR 72 | Ht 73.0 in | Wt 249.5 lb

## 2024-01-18 DIAGNOSIS — Z0289 Encounter for other administrative examinations: Secondary | ICD-10-CM

## 2024-01-18 DIAGNOSIS — H6123 Impacted cerumen, bilateral: Secondary | ICD-10-CM

## 2024-01-18 DIAGNOSIS — F1011 Alcohol abuse, in remission: Secondary | ICD-10-CM

## 2024-01-18 DIAGNOSIS — G894 Chronic pain syndrome: Secondary | ICD-10-CM

## 2024-01-18 DIAGNOSIS — M4802 Spinal stenosis, cervical region: Secondary | ICD-10-CM

## 2024-01-18 DIAGNOSIS — M47812 Spondylosis without myelopathy or radiculopathy, cervical region: Secondary | ICD-10-CM | POA: Diagnosis not present

## 2024-01-18 DIAGNOSIS — F5101 Primary insomnia: Secondary | ICD-10-CM | POA: Diagnosis not present

## 2024-01-18 DIAGNOSIS — I10 Essential (primary) hypertension: Secondary | ICD-10-CM

## 2024-01-18 DIAGNOSIS — K700 Alcoholic fatty liver: Secondary | ICD-10-CM | POA: Insufficient documentation

## 2024-01-18 DIAGNOSIS — K921 Melena: Secondary | ICD-10-CM

## 2024-01-18 DIAGNOSIS — F331 Major depressive disorder, recurrent, moderate: Secondary | ICD-10-CM

## 2024-01-18 DIAGNOSIS — S46012D Strain of muscle(s) and tendon(s) of the rotator cuff of left shoulder, subsequent encounter: Secondary | ICD-10-CM

## 2024-01-18 DIAGNOSIS — H9191 Unspecified hearing loss, right ear: Secondary | ICD-10-CM

## 2024-01-18 MED ORDER — MIRTAZAPINE 7.5 MG PO TABS: 7.5000 mg | ORAL_TABLET | Freq: Every day | ORAL | 1 refills | Status: DC

## 2024-01-18 MED ORDER — LOSARTAN POTASSIUM-HCTZ 100-25 MG PO TABS: 1.0000 | ORAL_TABLET | Freq: Every day | ORAL | 1 refills | Status: DC

## 2024-01-18 MED ORDER — PREGABALIN 150 MG PO CAPS: 150.0000 mg | ORAL_CAPSULE | Freq: Two times a day (BID) | ORAL | 1 refills | Status: DC

## 2024-01-18 MED ORDER — DULOXETINE HCL 60 MG PO CPEP: 60.0000 mg | ORAL_CAPSULE | Freq: Two times a day (BID) | ORAL | 1 refills | Status: DC

## 2024-01-18 MED ORDER — TRAMADOL HCL 50 MG PO TABS: 50.0000 mg | ORAL_TABLET | Freq: Three times a day (TID) | ORAL | 0 refills | Status: DC

## 2024-01-18 NOTE — Progress Notes (Unsigned)
 Established Patient Office Visit  Subjective   Patient ID: Thomas Jennings, male    DOB: 1981/08/03  Age: 43 y.o. MRN: [id number]  Chief Complaint  Patient presents with   Medical Management of Chronic Issues    GAD, MDD, HTN pt states he has an colonoscopy on the 31st of this month and is concerned whether he should keep this appt or reschedule due to the increased blood in his stool    HPI Pt is a 43 yo male who presents to the clinic to follow-up on chronic pain, hypertension, MDD, GAD and to discuss hearing loss in his right ear.  Patient reports to be 8 months sober from any alcohol.   Patient has been trying to be compliant with medications however he has run into some issues with cost with Lyrica.  He continues to be in chronic pain but the tramadol, Mobic, Cymbalta does help make it managable. Lyrica helps but does not always take regularly.   He does report ear fullness and hearing loss in right ear.   Denies any CP, palpitations, headaches or vision changes. Not checking BP at home.   .. Active Ambulatory Problems    Diagnosis Date Noted   Cervical spinal stenosis 03/09/2017   Cervical spondylosis without myelopathy 02/22/2013   Elevated blood pressure reading 03/09/2017   SOB (shortness of breath) 03/09/2017   GAD (generalized anxiety disorder) 03/09/2017   Insomnia 03/09/2017   Essential hypertension 05/11/2017   History of diverticulitis 08/05/2017   Atypical chest pain 08/05/2017   Gastroesophageal reflux disease with esophagitis 08/05/2017   Moderate episode of recurrent major depressive disorder (HCC) 08/05/2017   Carpal tunnel syndrome, bilateral 02/02/2018   Drug-induced erectile dysfunction 04/23/2018   Chronic pain syndrome 07/16/2018   Lipoma of torso 10/21/2018   Blood in stool 10/21/2018   Pancreatic mass 06/29/2019   Lower abdominal pain 06/29/2019   Left lateral epicondylitis 08/09/2019   Elevated ALT measurement 10/16/2020   Poor concentration  03/18/2021   Bilateral hearing loss 03/18/2021   Insomnia due to mental disorder 08/01/2021   Suspected COVID-19 virus infection 12/30/2021   Stuttering 12/30/2021   History of URI (upper respiratory infection) 12/30/2021   Chronic cough 03/18/2022   Vitamin D insufficiency 03/19/2022   Elevated hemoglobin (HCC) 03/19/2022   Hypertriglyceridemia 03/19/2022   Right hip pain 03/20/2022   Alcohol abuse 11/10/2022   Traumatic tear of left rotator cuff 06/29/2023   Acute pain of left shoulder 06/29/2023   Alcoholism in remission (HCC) 06/30/2023   Abnormal weight gain 07/15/2023   Pain management contract agreement 07/17/2023   Nausea and vomiting 07/30/2023   Pharyngoesophageal dysphagia 07/30/2023   Alcohol abuse, in remission 07/30/2023   Ringworm of body 07/30/2023   Alcohol induced fatty liver 01/18/2024   Hematochezia 01/19/2024   Melena 01/19/2024   Hearing loss of right ear 01/19/2024   Bilateral impacted cerumen 01/19/2024   Resolved Ambulatory Problems    Diagnosis Date Noted   No Resolved Ambulatory Problems   Past Medical History:  Diagnosis Date   Cervical spondylosis with myelopathy and radiculopathy 03/09/2017   Primary insomnia 03/09/2017     ROS See HPI.    Objective:     BP (!) 141/99   Pulse 72   Ht 6\' 1"  (1.854 m)   Wt 249 lb 8 oz (113.2 kg)   SpO2 95%   BMI 32.92 kg/m  BP Readings from Last 3 Encounters:  01/18/24 (!) 141/99  10/20/23 (!) 164/112  07/29/23 [name])  142/88   Wt Readings from Last 3 Encounters:  01/18/24 249 lb 8 oz (113.2 kg)  10/20/23 246 lb 9.6 oz (111.9 kg)  07/29/23 241 lb (109.3 kg)    Physical Exam Constitutional:      Appearance: Normal appearance.  HENT:     Head: Normocephalic.     Right Ear: There is impacted cerumen.     Left Ear: There is impacted cerumen.     Ears:     Comments: After irrigation bilateral TMs with scarring right worse than left. Right very opaque and pearly appearance.     Nose: Nose normal.   Cardiovascular:     Rate and Rhythm: Normal rate and regular rhythm.  Pulmonary:     Effort: Pulmonary effort is normal.     Breath sounds: Normal breath sounds.  Neurological:     General: No focal deficit present.     Mental Status: He is alert and oriented to person, place, and time.  Psychiatric:        Mood and Affect: Mood normal.   .. Hearing Screening   500Hz  1000Hz  2000Hz  3000Hz  4000Hz   Right ear Fail Fail Fail Pass Pass  Left ear 40 40 40 40 40    .[name] KitchenCerumen Removal Template: Indication: Cerumen impaction of the ear(s) Medical necessity statement: On physical examination, cerumen impairs clinically significant portions of the external auditory canal, and tympanic membrane. Noted obstructive, copious cerumen that cannot be removed without magnification and instrumentations requiring physician skills Consent: Discussed benefits and risks of procedure and verbal consent obtained Procedure: Patient was prepped for the procedure. Utilized an otoscope to assess and take note of the ear canal, the tympanic membrane, and the presence, amount, and placement of the cerumen. Gentle water irrigation and soft plastic curette was utilized to remove cerumen.  Post procedure examination shows cerumen was completely removed. Patient tolerated procedure well. The patient is made aware that they may experience temporary vertigo, temporary hearing loss, and temporary discomfort. If these symptom last for more than 24 hours to call the clinic or proceed to the ED.     Assessment & Plan:  [name] KitchenMarland KitchenAndren was seen today for medical management of chronic issues.  Diagnoses and all orders for this visit:  Essential hypertension -     losartan-hydrochlorothiazide (HYZAAR) 100-25 MG tablet; Take 1 tablet by mouth daily.  Traumatic tear of left rotator cuff, unspecified tear extent, subsequent encounter -     traMADol (ULTRAM) 50 MG tablet; Take 1 tablet (50 mg total) by mouth every 8 (eight) hours. -      pregabalin (LYRICA) 150 MG capsule; Take 1 capsule (150 mg total) by mouth 2 (two) times daily.  Cervical spinal stenosis -     traMADol (ULTRAM) 50 MG tablet; Take 1 tablet (50 mg total) by mouth every 8 (eight) hours. -     DULoxetine (CYMBALTA) 60 MG capsule; Take 1 capsule (60 mg total) by mouth 2 (two) times daily. -     pregabalin (LYRICA) 150 MG capsule; Take 1 capsule (150 mg total) by mouth 2 (two) times daily.  Pain management contract agreement -     traMADol (ULTRAM) 50 MG tablet; Take 1 tablet (50 mg total) by mouth every 8 (eight) hours. -     DULoxetine (CYMBALTA) 60 MG capsule; Take 1 capsule (60 mg total) by mouth 2 (two) times daily. -     pregabalin (LYRICA) 150 MG capsule; Take 1 capsule (150 mg total) by mouth 2 (two) times  daily.  Cervical spondylosis without myelopathy -     traMADol (ULTRAM) 50 MG tablet; Take 1 tablet (50 mg total) by mouth every 8 (eight) hours. -     DULoxetine (CYMBALTA) 60 MG capsule; Take 1 capsule (60 mg total) by mouth 2 (two) times daily. -     pregabalin (LYRICA) 150 MG capsule; Take 1 capsule (150 mg total) by mouth 2 (two) times daily.  Hematochezia  Melena  Alcohol abuse, in remission  Alcohol induced fatty liver  Primary insomnia -     mirtazapine (REMERON) 7.5 MG tablet; Take 1 tablet (7.5 mg total) by mouth at bedtime.  Moderate episode of recurrent major depressive disorder (HCC) -     DULoxetine (CYMBALTA) 60 MG capsule; Take 1 capsule (60 mg total) by mouth 2 (two) times daily.  Chronic pain syndrome -     traMADol (ULTRAM) 50 MG tablet; Take 1 tablet (50 mg total) by mouth every 8 (eight) hours. -     DULoxetine (CYMBALTA) 60 MG capsule; Take 1 capsule (60 mg total) by mouth 2 (two) times daily. -     pregabalin (LYRICA) 150 MG capsule; Take 1 capsule (150 mg total) by mouth 2 (two) times daily.  Bilateral impacted cerumen  Hearing loss of right ear, unspecified hearing loss type -     Ambulatory referral to  ENT   Continue follow up with GI for endoscopy/colonoscopy  BP not to goal Stop cozaar Start hyzaar Nurse visit in 2 weeks  Refilled remeron for sleep  For pain and mood Increased cymbalta to 60mg  bid Increased lyrica 150mg  bid Continue tramadol bid .[name] KitchenPDMP reviewed during this encounter. 8 months no alcohol Follow up in 3 months  Cerumen removed in office Hearing testing done and failed to hear at most frequencies until over 40db TMs appear to have some scarring ENT referral placed      Return in about 2 weeks (around 02/01/2024) for BP recheck/ 3 months OV.    Tandy Gaw, PA-C

## 2024-01-18 NOTE — Patient Instructions (Addendum)
 Stop cozaar. Start losaartan/hydrochlorothiazide for blood pressure Nurse visit in 2 weeks  Increase cymbalta to 60mg  twice a day  Increase lyrica to 150mg  twice a day

## 2024-01-19 ENCOUNTER — Encounter: Payer: Self-pay | Admitting: Physician Assistant

## 2024-01-19 DIAGNOSIS — H6123 Impacted cerumen, bilateral: Secondary | ICD-10-CM | POA: Insufficient documentation

## 2024-01-19 DIAGNOSIS — H6121 Impacted cerumen, right ear: Secondary | ICD-10-CM | POA: Insufficient documentation

## 2024-01-19 DIAGNOSIS — K921 Melena: Secondary | ICD-10-CM | POA: Insufficient documentation

## 2024-01-19 DIAGNOSIS — H9191 Unspecified hearing loss, right ear: Secondary | ICD-10-CM | POA: Insufficient documentation

## 2024-01-20 ENCOUNTER — Encounter: Payer: Self-pay | Admitting: Physician Assistant

## 2024-02-02 ENCOUNTER — Ambulatory Visit: Payer: MEDICAID

## 2024-02-09 ENCOUNTER — Other Ambulatory Visit: Payer: Self-pay | Admitting: Physician Assistant

## 2024-02-09 DIAGNOSIS — I10 Essential (primary) hypertension: Secondary | ICD-10-CM

## 2024-03-31 IMAGING — DX DG HIP (WITH OR WITHOUT PELVIS) 2-3V*R*
3 series · 3 of 3 positions shown · non-contrast
Comparison: None Available.

CLINICAL DATA: Right lateral hip pain for six-months.

EXAM:
DG HIP (WITH OR WITHOUT PELVIS) 2-3V RIGHT

[pelvis ap]
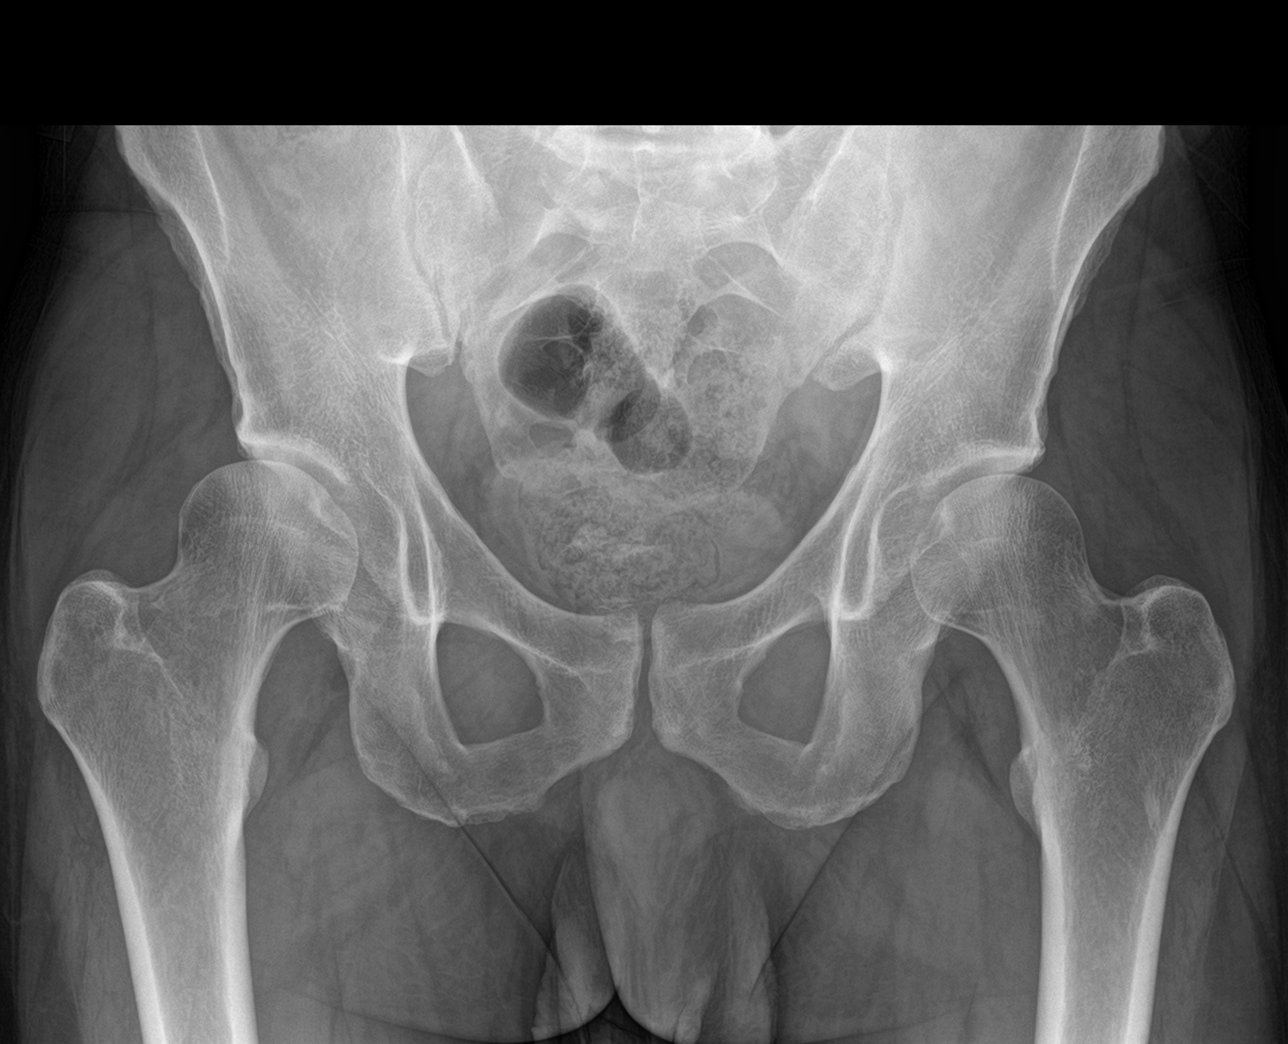

[hip ap]
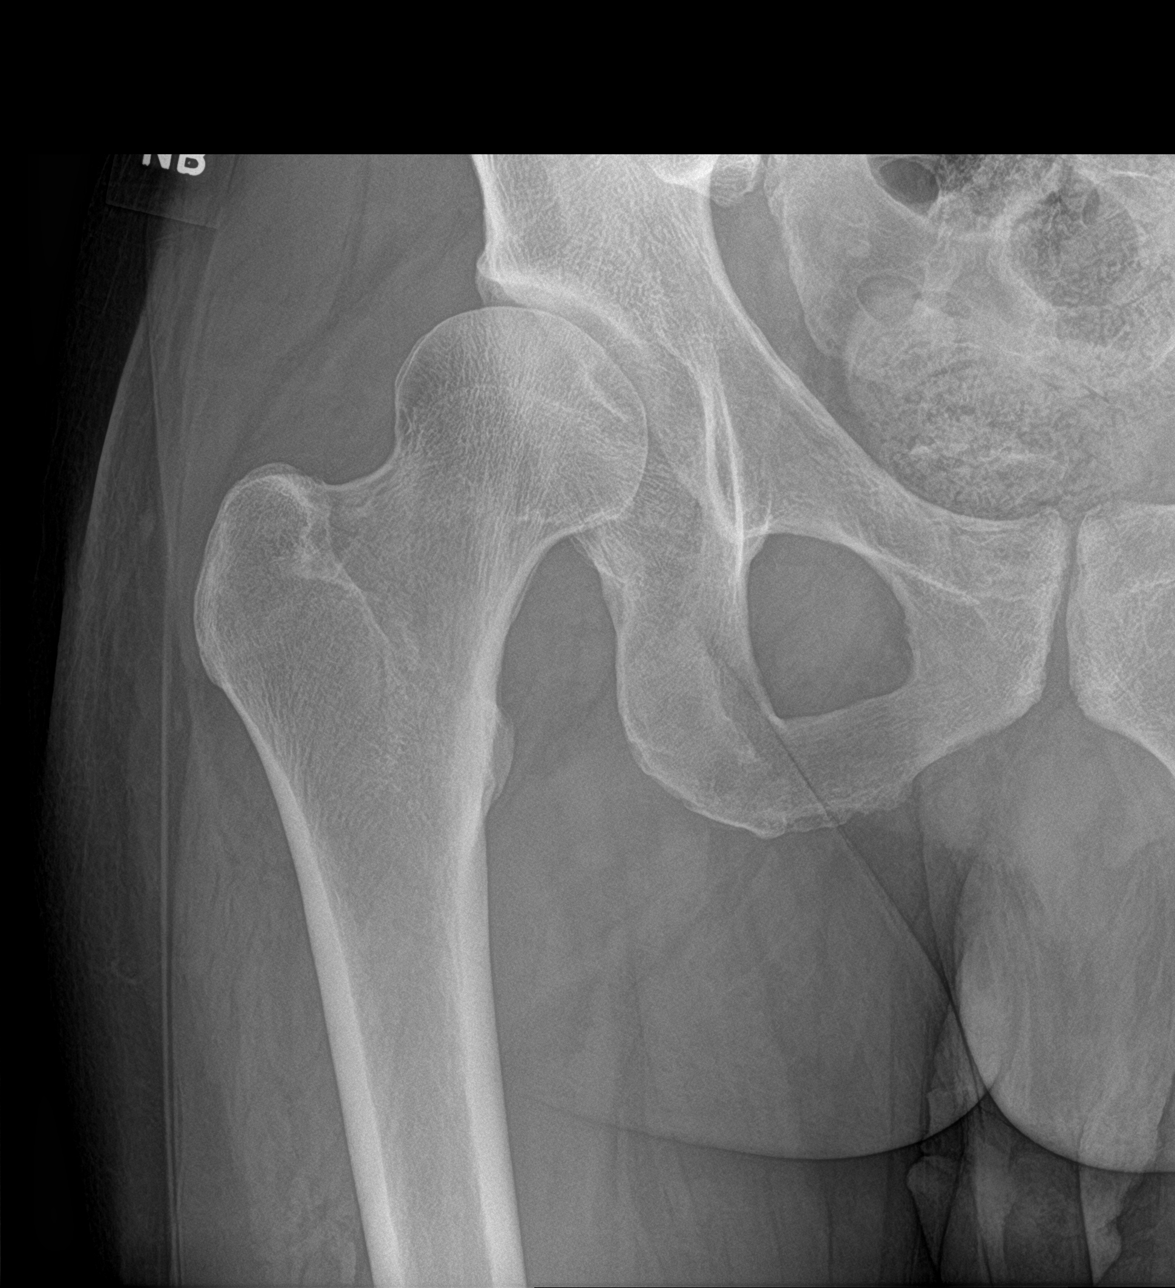

[hip lat]
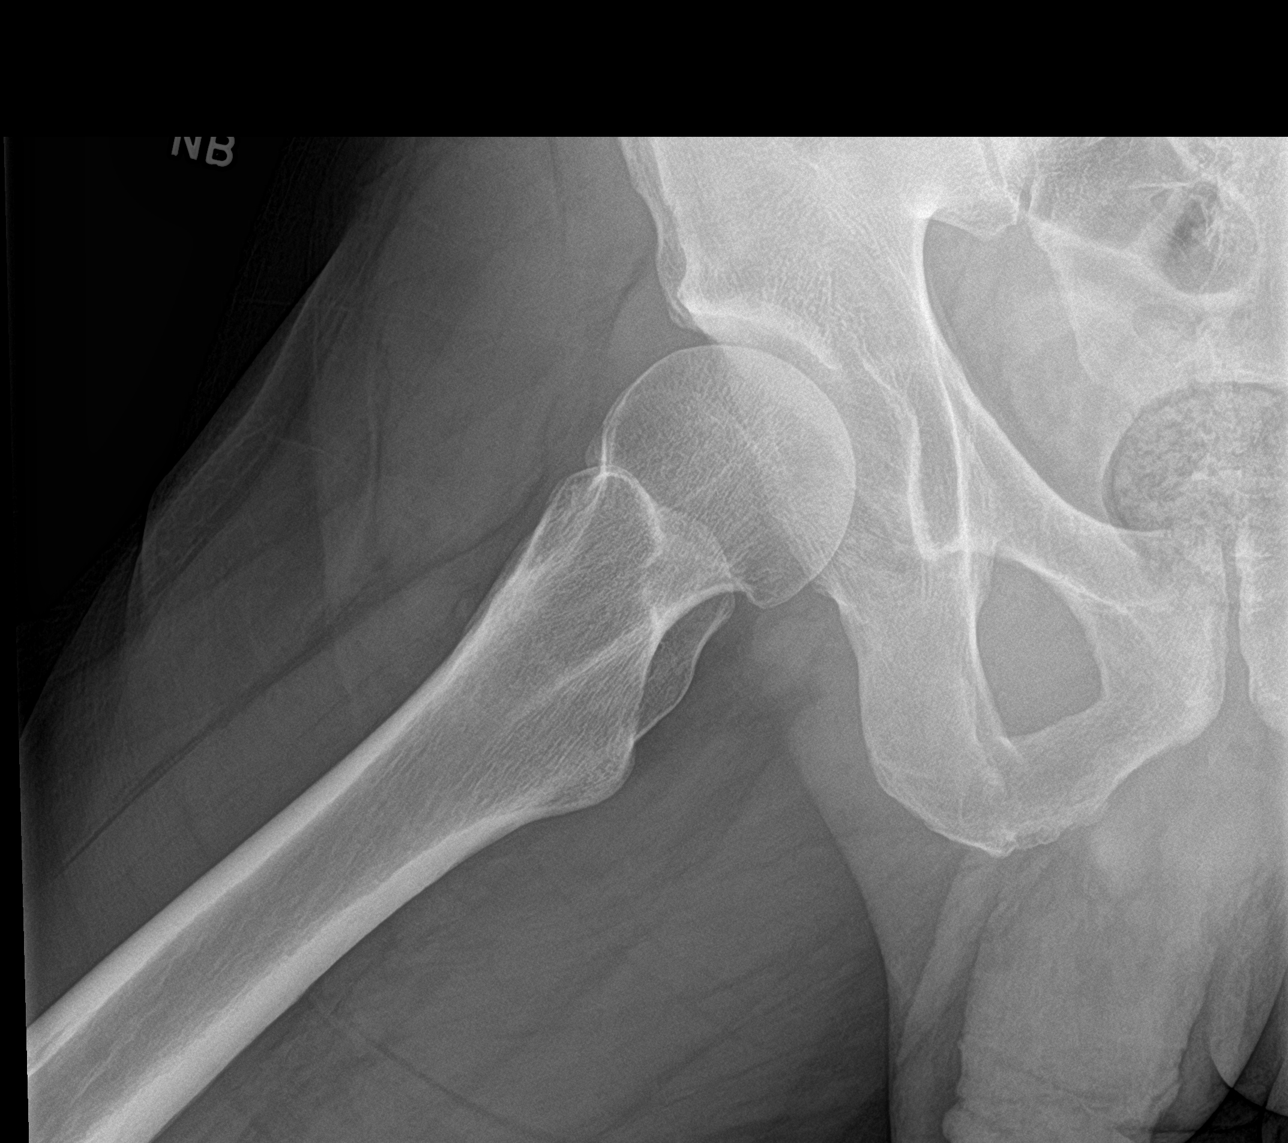

[3 of 3 positions shown; findings below may reference images not displayed]

FINDINGS: There is no evidence of hip fracture or dislocation. There is no
evidence of arthropathy. Small bone island is identified in the left
proximal femoral shaft
IMPRESSION: Negative.

## 2024-03-31 IMAGING — DX DG CHEST 2V
3 series · 3 of 3 positions shown · non-contrast
Comparison: None Available.

CLINICAL DATA: Persistent cough for 2 months.

EXAM:
CHEST - 2 VIEW

[chest pa]
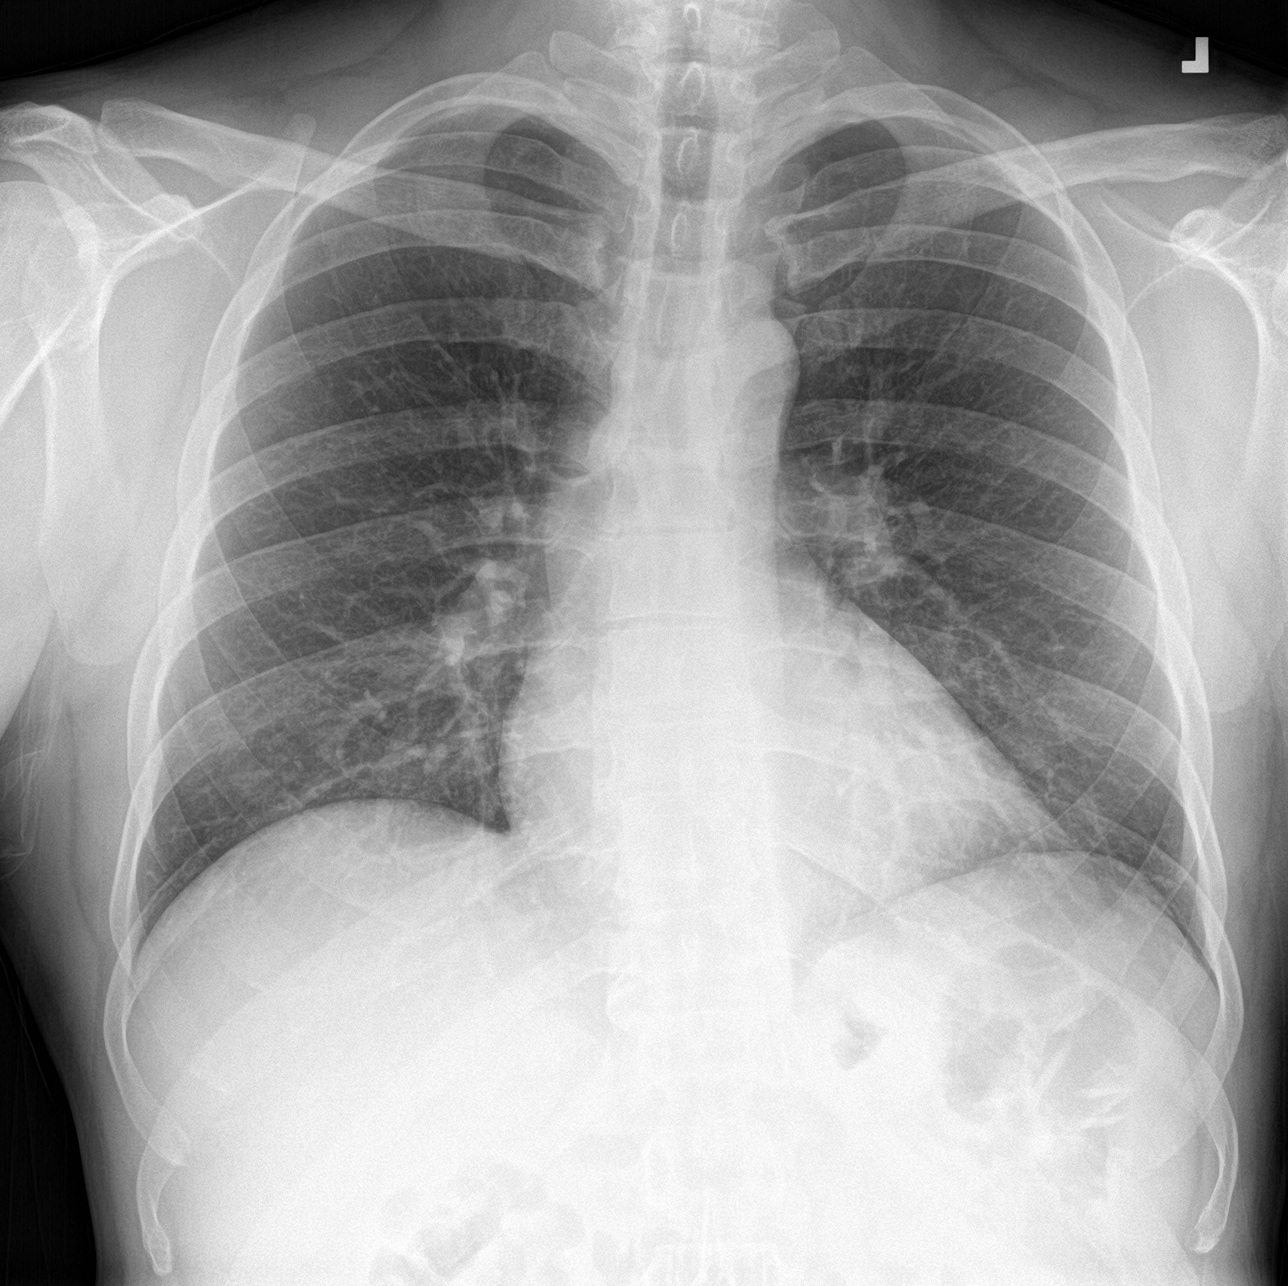

[chest lat (1 of 2)]
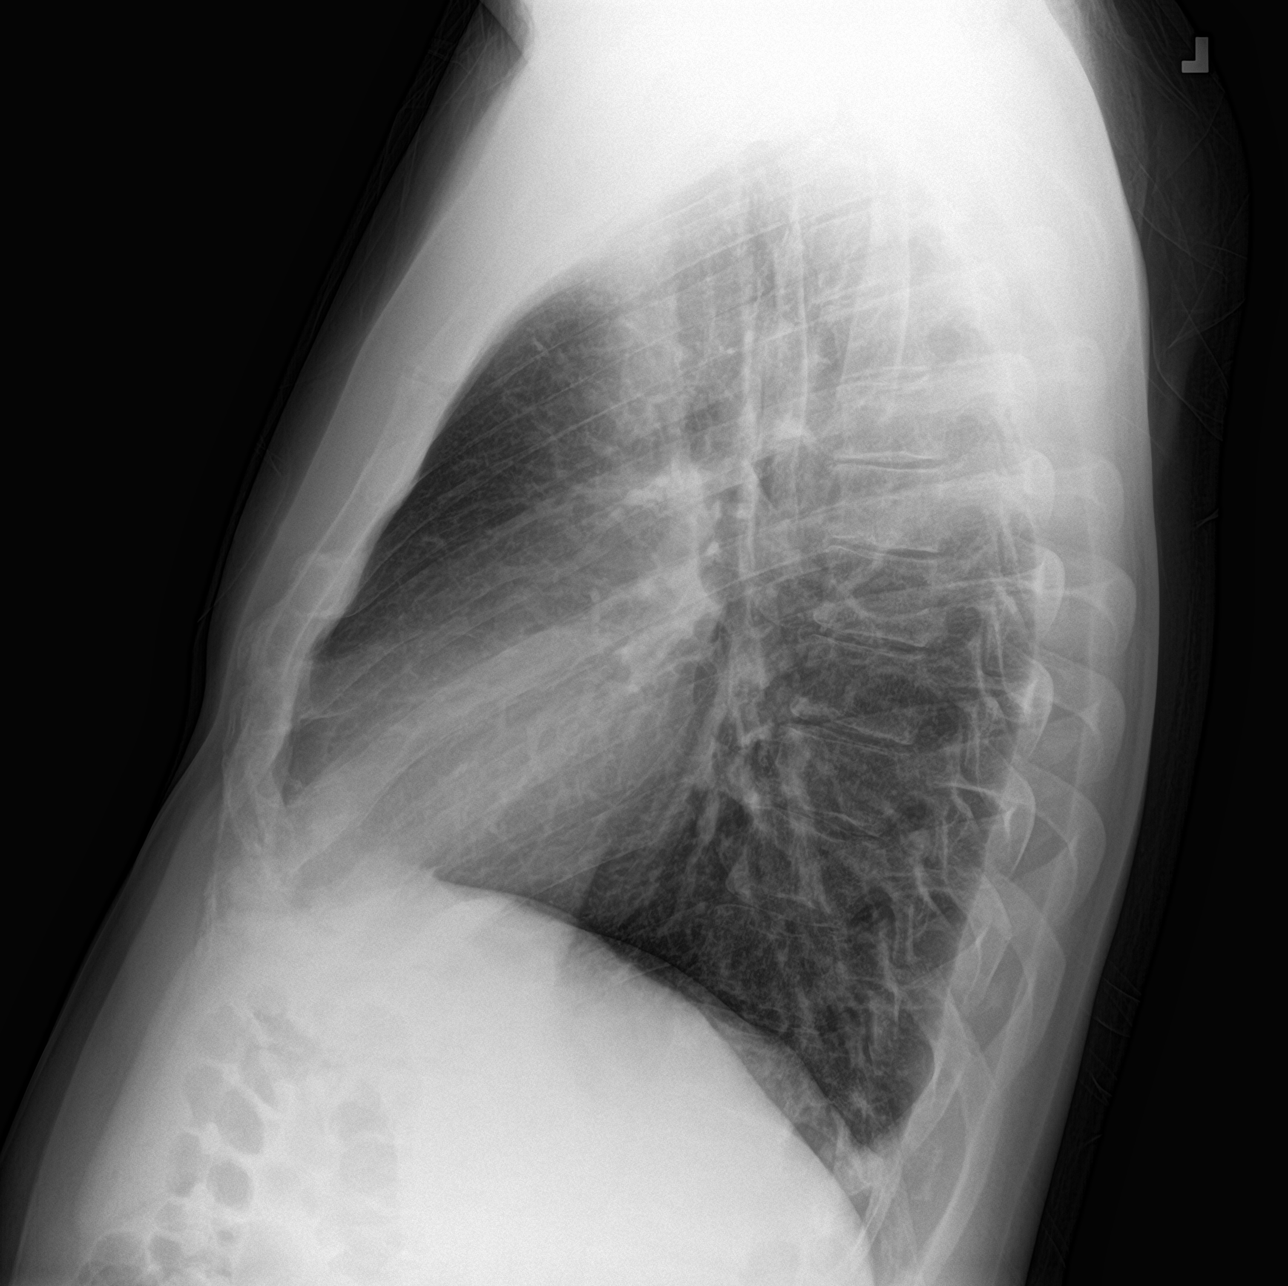

[chest lat (2 of 2)]
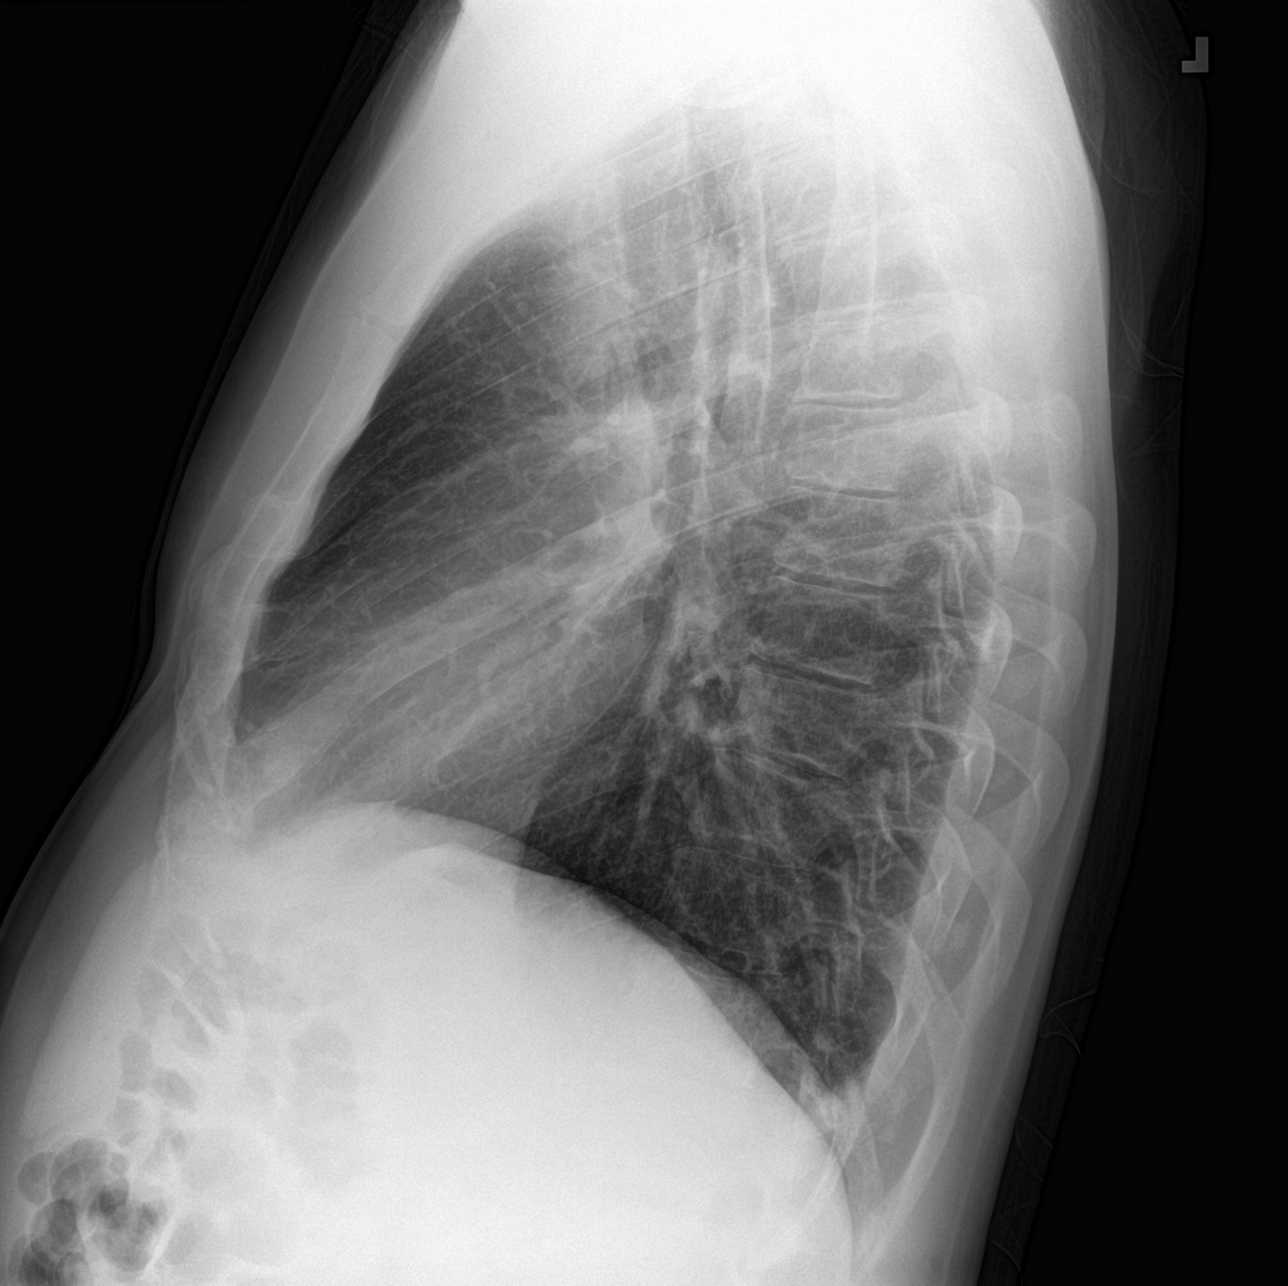

[3 of 3 positions shown; findings below may reference images not displayed]

FINDINGS: The heart size and mediastinal contours are within normal limits.
Both lungs are clear. The visualized skeletal structures are
unremarkable.
IMPRESSION: No active cardiopulmonary disease.

## 2024-04-19 ENCOUNTER — Encounter: Payer: Self-pay | Admitting: Family Medicine

## 2024-04-19 ENCOUNTER — Telehealth (INDEPENDENT_AMBULATORY_CARE_PROVIDER_SITE_OTHER): Payer: MEDICAID | Admitting: Family Medicine

## 2024-04-19 ENCOUNTER — Ambulatory Visit: Payer: Self-pay | Admitting: Physician Assistant

## 2024-04-19 ENCOUNTER — Ambulatory Visit: Payer: MEDICAID | Admitting: Physician Assistant

## 2024-04-19 ENCOUNTER — Telehealth: Payer: Self-pay

## 2024-04-19 VITALS — BP 150/110 | Temp 99.1°F

## 2024-04-19 DIAGNOSIS — J029 Acute pharyngitis, unspecified: Secondary | ICD-10-CM

## 2024-04-19 MED ORDER — PREDNISONE 20 MG PO TABS: 40.0000 mg | ORAL_TABLET | Freq: Every day | ORAL | 0 refills | Status: DC

## 2024-04-19 NOTE — Transitions of Care (Post Inpatient/ED Visit) (Signed)
   04/19/2024  Name: Taurus Alamo MRN: [id number] DOB: [birth year]  Today's TOC FU Call Status: Today's TOC FU Call Status:: Unsuccessful Call (1st Attempt) Unsuccessful Call (1st Attempt) Date: 04/19/24  Attempted to reach the patient regarding the most recent Inpatient/ED visit.  Follow Up Plan: Additional outreach attempts will be made to reach the patient to complete the Transitions of Care (Post Inpatient/ED visit) call.   Signature Darrall Ellison, LPN [facility] Nurse Health Advisor Direct Dial [phone]

## 2024-04-19 NOTE — Progress Notes (Signed)
 Pt stated that he was released from the hospital yesterday for Anxiety. He was there from 6/12 until being released on yesterday.

## 2024-04-19 NOTE — Telephone Encounter (Signed)
 FYI Only or Action Required?: FYI only for provider  Patient was last seen in primary care on 01/18/2024 by Thomas Knight, PA-C. Called Nurse Triage reporting Sore Throat. Symptoms began yesterday. Interventions attempted: Nothing. Symptoms are: gradually worsening.  Triage Disposition: See Physician Within 24 Hours  Patient/caregiver understands and will follow disposition?: Yes     Copied from CRM [id number]. Topic: Clinical - Red Word Triage >> Apr 19, 2024  1:33 PM Retta Caster wrote: Red Word that prompted transfer to Nurse Triage: Cough/Head pounding pressure/sore throat/Out of breath/Can not breath thru nose 06/16 Reason for Disposition  SEVERE (e.g., excruciating) throat pain  Answer Assessment - Initial Assessment Questions Sore throat started last night and worsened over night (4/10 pain level) Started yesterday not feeling right Difficulty swallowing but can swallow Unable to breathe through nose Headache 6/10 pain level Denies fever and cough Denies being around someone with strep throat but was recently in hospital  Protocols used: Sore Throat-A-AH

## 2024-04-19 NOTE — Progress Notes (Signed)
    Virtual Visit via Video Note  I connected with Thomas Jennings on 04/19/24 at  2:20 PM EDT by a video enabled telemedicine application and verified that I am speaking with the correct person using two identifiers.   I discussed the limitations of evaluation and management by telemedicine and the availability of in person appointments. The patient expressed understanding and agreed to proceed.  Patient location: at home Provider location: in office  Subjective:    CC:   Chief Complaint  Patient presents with   Sore Throat    X1 day he states that his lymph nodes. Pedialyte and bone broth he hasn't tried any cough/cold medication.     HPI: He says that starting last night he developed a sore throat and nasal congestion.  He said his head felt hot and he just went to bed early.  Today he feels like his neck feels tight and like his lymph nodes are really swollen it is really hard and painful to swallow.  No known sick contacts.  Temperature was 99.3 this morning he says his chest feels a little tight when he takes a deep breath.  No rashes or diarrhea.  He has done salt water gargle but not taking any medications.  Past medical history, Surgical history, Family history not pertinant except as noted below, Social history, Allergies, and medications have been entered into the medical record, reviewed, and corrections made.    Objective:    General: Speaking clearly in complete sentences without any shortness of breath.  Alert and oriented x3.  Normal judgment. No apparent acute distress.    Impression and Recommendations:    Problem List Items Addressed This Visit   None Visit Diagnoses       Sore throat    -  Primary      Sore throat-most consistent with viral illness especially with concomitant can nasal congestion.  Will send over prescription for prednisone .  Recommend over-the-counter pain reliever and Chloraseptic spray for relief.  If not improving in the next couple  days then we may need to consider evaluation for strep throat.  No orders of the defined types were placed in this encounter.   Meds ordered this encounter  Medications   predniSONE  (DELTASONE ) 20 MG tablet    Sig: Take 2 tablets (40 mg total) by mouth daily with breakfast.    Dispense:  10 tablet    Refill:  0     I discussed the assessment and treatment plan with the patient. The patient was provided an opportunity to ask questions and all were answered. The patient agreed with the plan and demonstrated an understanding of the instructions.   The patient was advised to call back or seek an in-person evaluation if the symptoms worsen or if the condition fails to improve as anticipated.   Duaine German, MD

## 2024-04-20 ENCOUNTER — Ambulatory Visit: Payer: MEDICAID | Admitting: Physician Assistant

## 2024-04-20 NOTE — Transitions of Care (Post Inpatient/ED Visit) (Signed)
   04/20/2024  Name: Thomas Jennings MRN: [id number] DOB: 12/05/1980  Today's TOC FU Call Status: Today's TOC FU Call Status:: Unsuccessful Call (1st Attempt) Unsuccessful Call (1st Attempt) Date: 04/19/24  Attempted to reach the patient regarding the most recent Inpatient/ED visit.  Follow Up Plan: No further outreach attempts will be made at this time. We have been unable to contact the patient. Patient already seen Signature Darrall Ellison, LPN [facility] Nurse Health Advisor Direct Dial [phone]

## 2024-04-22 ENCOUNTER — Telehealth: Payer: Self-pay | Admitting: Family Medicine

## 2024-04-22 NOTE — Telephone Encounter (Signed)
 Copied from CRM [id number]. Topic: Appointments - Appointment Info/Confirmation >> Apr 22, 2024 10:54 AM Opal Bill wrote: Patient says he got a call about the apt he missed 04/20/24. He says that it should have been cleared because he had a viral infection and was seen virtually 04/19/24 by Dr. Greer Leak. That is why he did not show for 6/18. A note was supposed to be sent to Dr. Lindaann Requena about it.

## 2024-04-25 ENCOUNTER — Encounter: Payer: Self-pay | Admitting: Physician Assistant

## 2024-04-25 ENCOUNTER — Ambulatory Visit (INDEPENDENT_AMBULATORY_CARE_PROVIDER_SITE_OTHER): Payer: MEDICAID | Admitting: Physician Assistant

## 2024-04-25 VITALS — BP 150/90 | HR 96 | Ht 73.0 in | Wt 239.0 lb

## 2024-04-25 DIAGNOSIS — Z09 Encounter for follow-up examination after completed treatment for conditions other than malignant neoplasm: Secondary | ICD-10-CM

## 2024-04-25 DIAGNOSIS — S46012D Strain of muscle(s) and tendon(s) of the rotator cuff of left shoulder, subsequent encounter: Secondary | ICD-10-CM

## 2024-04-25 DIAGNOSIS — M4802 Spinal stenosis, cervical region: Secondary | ICD-10-CM

## 2024-04-25 DIAGNOSIS — I10 Essential (primary) hypertension: Secondary | ICD-10-CM | POA: Diagnosis not present

## 2024-04-25 DIAGNOSIS — F1010 Alcohol abuse, uncomplicated: Secondary | ICD-10-CM | POA: Diagnosis not present

## 2024-04-25 DIAGNOSIS — F250 Schizoaffective disorder, bipolar type: Secondary | ICD-10-CM | POA: Diagnosis not present

## 2024-04-25 MED ORDER — AMLODIPINE BESYLATE 2.5 MG PO TABS: 2.5000 mg | ORAL_TABLET | Freq: Every day | ORAL | 0 refills | Status: DC

## 2024-04-25 MED ORDER — LOSARTAN POTASSIUM-HCTZ 100-25 MG PO TABS: 1.0000 | ORAL_TABLET | Freq: Every day | ORAL | 1 refills | Status: DC

## 2024-04-25 MED ORDER — RISPERIDONE 2 MG PO TABS: 2.0000 mg | ORAL_TABLET | Freq: Two times a day (BID) | ORAL | 1 refills | Status: DC

## 2024-04-25 MED ORDER — MELOXICAM 15 MG PO TABS: 15.0000 mg | ORAL_TABLET | Freq: Every day | ORAL | 0 refills | Status: DC

## 2024-04-25 NOTE — Progress Notes (Unsigned)
   Established Patient Office Visit  Subjective   Patient ID: Thomas Jennings, male    DOB: August 12, 1981  Age: 43 y.o. MRN: [id number]  Chief Complaint  Patient presents with   Hospitalization Follow-up    Phq-gad    HPI Pt is a 43 yo male with a hx of substance and alcohol  abuse who presents to the clinic for hospital follow up after being admitted for suicidal ideation. He was admitted on 6/13 and discharged on 6/16. He was binge drinking and threatening to cut himself. Ativan  detox while in hospital. Start risperdal 1mg  bid and fenofibrate 134mg  daily. Made appt for patient to follow up with [facility] on [facility].   He was a little over a year sober before this replase. He reports getting really sad about not seeing his daughter and started drinking.   He continues to have chronic pain in upper back and neck due to cervical DDD even despite surgery.    ROS See HPI.    Objective:     BP (!) 150/90   Pulse 96   Ht 6' 1 (1.854 m)   Wt 239 lb (108.4 kg)   SpO2 99%   BMI 31.53 kg/m  BP Readings from Last 3 Encounters:  04/25/24 (!) 150/90  04/19/24 (!) 150/110  01/18/24 (!) 141/99   Wt Readings from Last 3 Encounters:  04/25/24 239 lb (108.4 kg)  01/18/24 249 lb 8 oz (113.2 kg)  10/20/23 246 lb 9.6 oz (111.9 kg)      Physical Exam Constitutional:      Appearance: Normal appearance.   Cardiovascular:     Rate and Rhythm: Normal rate.  Pulmonary:     Effort: Pulmonary effort is normal.   Neurological:     Mental Status: He is alert.   Psychiatric:        Mood and Affect: Mood normal.       The 10-year ASCVD risk score (Arnett DK, et al., 2019) is: 2.5%    Assessment & Plan:  SABRASABRAHamdan was seen today for hospitalization follow-up.  Diagnoses and all orders for this visit:  Hospital discharge follow-up  Essential hypertension -     losartan -hydrochlorothiazide (HYZAAR) 100-25 MG tablet; Take 1 tablet by mouth daily. -      amLODipine  (NORVASC ) 2.5 MG tablet; Take 1 tablet (2.5 mg total) by mouth daily.  Schizoaffective disorder, bipolar type (HCC) -     risperiDONE (RISPERDAL) 2 MG tablet; Take 1 tablet (2 mg total) by mouth 2 (two) times daily.  Traumatic tear of left rotator cuff, unspecified tear extent, subsequent encounter -     meloxicam  (MOBIC ) 15 MG tablet; Take 1 tablet (15 mg total) by mouth daily.  Cervical spinal stenosis -     meloxicam  (MOBIC ) 15 MG tablet; Take 1 tablet (15 mg total) by mouth daily.  Alcohol  abuse   Encouraged patient that relapsing is a part of recovery Keep follow up with family services for more mental health help Increased Risperdal to 2mg  bid, discussed new dx of schizoaffective disorder Encouraged compliance Need to get in with counseling for substance abuse problems BP not to goal added norvasc  2.5mg   Follow up in 4-8 weeks on BP    Vermell Bologna, PA-C

## 2024-04-25 NOTE — Telephone Encounter (Signed)
 Appointment 04/20/2024 changed from [place] to Canceled. Left message on patients voicemail.

## 2024-04-25 NOTE — Patient Instructions (Addendum)
 Name of Provider Agency Referred: [facility] [place], [place] Date/Time of Appointment: Walk-in hours are 8:30am to 12pm, 2pm - 3:30pm Monday to Friday Phone Number: [phone] Address: [address], [place], [place] [address]  Reason: Substance Use/Mental Health Treatment   Increased risperdal to 2mg  twice a day.  Norvasc  2.5mg  daily to hyzaar for blood pressure

## 2024-04-27 ENCOUNTER — Encounter: Payer: Self-pay | Admitting: Physician Assistant

## 2024-05-09 ENCOUNTER — Ambulatory Visit: Payer: Self-pay

## 2024-05-09 NOTE — Telephone Encounter (Signed)
 The patient has an appointment scheduled with the provider on 05/10/24 at 140 pm.

## 2024-05-09 NOTE — Telephone Encounter (Signed)
 FYI Only or Action Required?: FYI only for provider.  Patient was last seen in primary care on 04/25/2024 by Antoniette Vermell CROME, PA-C.  Called Nurse Triage reporting Eye Pain.  Symptoms began several days ago.  Interventions attempted: Rest, hydration, or home remedies.  Symptoms are: gradually worsening.  Triage Disposition: See Physician Within 24 Hours  Patient/caregiver understands and will follow disposition?: Yes                              Copied from CRM [id number]. Topic: Clinical - Red Word Triage >> May 09, 2024  4:28 PM Adrianna P wrote: Red Word that prompted transfer to Nurse Triage: Swollen eye, painful, hard to see out of   ----------------------------------------------------------------------- From previous Reason for Contact - Scheduling: Patient/patient representative is calling to schedule an appointment. Refer to attachments for appointment information. Reason for Disposition  Yellow or green pus occurs  Answer Assessment - Initial Assessment Questions 1. ONSET: When did the pain start? (e.g., minutes, hours, days)     Pain started 2 days ago, swelling started this morning  3. SEVERITY: How bad is the pain?   (Scale 1-10; mild, moderate or severe)   - MILD (1-3): doesn't interfere with normal activities    - MODERATE (4-7): interferes with normal activities or awakens from sleep    - SEVERE (8-10): excruciating pain and patient unable to do normal activities     Rates pain a 5, states pain is constant 4. LOCATION: Where does it hurt?  (e.g., eyelid, eye, cheekbone)     Left side of left eye  5. CAUSE: What do you think is causing the pain?     Unsure 6. VISION: Do you have blurred vision or changes in your vision?      Yes, but it's blurry 7. EYE DISCHARGE: Is there any discharge (pus) from the eye(s)?  If Yes, ask: What color is it?      Yes, yellow 8. FEVER: Do you have a fever? If Yes, ask: What is it, how was  it measured, and when did it start?      Denies 9. OTHER SYMPTOMS: Do you have any other symptoms? (e.g., headache, nasal discharge, facial rash)     Redness in outside corner of eye  Protocols used: Eye Pain and Other Symptoms-A-AH

## 2024-05-10 ENCOUNTER — Encounter: Payer: Self-pay | Admitting: Physician Assistant

## 2024-05-10 ENCOUNTER — Ambulatory Visit (INDEPENDENT_AMBULATORY_CARE_PROVIDER_SITE_OTHER): Payer: MEDICAID | Admitting: Physician Assistant

## 2024-05-10 VITALS — BP 155/104 | HR 85 | Temp 98.2°F | Ht 73.0 in | Wt 237.8 lb

## 2024-05-10 DIAGNOSIS — H5712 Ocular pain, left eye: Secondary | ICD-10-CM | POA: Diagnosis not present

## 2024-05-10 DIAGNOSIS — G44209 Tension-type headache, unspecified, not intractable: Secondary | ICD-10-CM | POA: Diagnosis not present

## 2024-05-10 DIAGNOSIS — J0110 Acute frontal sinusitis, unspecified: Secondary | ICD-10-CM

## 2024-05-10 MED ORDER — DEXAMETHASONE SODIUM PHOSPHATE 10 MG/ML IJ SOLN
10.0000 mg | Freq: Once | INTRAMUSCULAR | Status: AC
  Administered 2024-05-10: 10 mg via INTRAMUSCULAR

## 2024-05-10 MED ORDER — KETOROLAC TROMETHAMINE 30 MG/ML IJ SOLN
30.0000 mg | Freq: Once | INTRAMUSCULAR | Status: AC
  Administered 2024-05-10: 30 mg via INTRAMUSCULAR

## 2024-05-10 MED ORDER — AMOXICILLIN-POT CLAVULANATE 875-125 MG PO TABS: 1.0000 | ORAL_TABLET | Freq: Two times a day (BID) | ORAL | 0 refills | Status: DC

## 2024-05-10 NOTE — Patient Instructions (Addendum)
 Start augmentin  Toradol  and decadron  given in office today Warm compresses over left eye  Stye A stye, also known as a hordeolum, is a bump that forms on an eyelid. It may look like a pimple next to the eyelash. A stye can form inside the eyelid (internal stye) or outside the eyelid (external stye). A stye can cause redness, swelling, and pain on the eyelid. Styes are very common. Anyone can get them at any age. They usually occur in just one eye at a time, but you may have more than one in either eye. What are the causes? A stye is caused by an infection. The infection is almost always caused by bacteria called Staphylococcus aureus. This is a common type of bacteria that lives on the skin. An internal stye may result from an infected oil-producing gland inside the eyelid. An external stye may be caused by an infection at the base of the eyelash (hair follicle). What increases the risk? You are more likely to develop a stye if: You have had a stye before. You have any of these conditions: Red, itchy, inflamed eyelids (blepharitis). A skin condition such as seborrheic dermatitis or rosacea. High fat levels in your blood (lipids). Dry eyes. What are the signs or symptoms? The most common symptom of a stye is eyelid pain. Internal styes are more painful than external styes. Other symptoms may include: Painful swelling of your eyelid. A scratchy feeling in your eye. Tearing and redness of your eye. A pimple-like bump on the edge of the eyelid. Pus draining from the stye. How is this diagnosed? Your health care provider may be able to diagnose a stye just by examining your eye. The health care provider may also check to make sure: You do not have a fever or other signs of a more serious infection. The infection has not spread to other parts of your eye or areas around your eye. How is this treated? Most styes will clear up in a few days without treatment or with warm compresses applied to  the area. You may need to use antibiotic drops or ointment to treat an infection. Sometimes, steroid drops or ointment are used in addition to antibiotics. In some cases, your health care provider may give you a small steroid injection in the eyelid. If your stye does not heal with routine treatment, your health care provider may drain pus from the stye using a thin blade or needle. This may be done if the stye is large, causing a lot of pain, or affecting your vision. Follow these instructions at home: Take over-the-counter and prescription medicines only as told by your health care provider. This includes eye drops or ointments. If you were prescribed an antibiotic medicine, steroid medicine, or both, apply or use them as told by your health care provider. Do not stop using the medicine even if your condition improves. Apply a warm, wet cloth (warm compress) to your eye for 5-10 minutes, 4 to 6 times a day. Clean the affected eyelid as directed by your health care provider. Do not wear contact lenses or eye makeup until your stye has healed and your health care provider says that it is safe. Do not try to pop or drain the stye. Do not rub your eye. Contact a health care provider if: You have chills or a fever. Your stye does not go away after several days. Your stye affects your vision. Your eyeball becomes swollen, red, or painful. Get help right away if: You  have pain when moving your eye around. Summary A stye is a bump that forms on an eyelid. It may look like a pimple next to the eyelash. A stye can form inside the eyelid (internal stye) or outside the eyelid (external stye). A stye can cause redness, swelling, and pain on the eyelid. Your health care provider may be able to diagnose a stye just by examining your eye. Apply a warm, wet cloth (warm compress) to your eye for 5-10 minutes, 4 to 6 times a day. This information is not intended to replace advice given to you by your health  care provider. Make sure you discuss any questions you have with your health care provider. Document Revised: 12/26/2020 Document Reviewed: 12/26/2020 Elsevier Patient Education  2024 [employer].

## 2024-05-10 NOTE — Progress Notes (Signed)
 Established Patient Office Visit  Subjective   Patient ID: Thomas Jennings, male    DOB: [birth year]  Age: 43 y.o. MRN: [id number]  Chief Complaint  Patient presents with   left eye pain and swelling    Patient c/o left eye pain x 2 days and Left eye was swollen  shut with yellow crust x yesterday but is better today. Pain is still severe per pt .      Patient presents with a 2-day history of left eye pain. He states yesterday the eye was swollen and there was yellow discharge, however today it is much better but the severe pain persists. Patient endorses left eye blurry vision, light sensitivity, swelling, drainage, and radiation of pain surrounding the left eye. He also endorses sinu pressure. Patient states he has a headache that is wrapping around his head and had little relief from Excedrin migraine. Patient denies any recent URI.   Review of Systems  Constitutional:  Negative for fever.  HENT:  Positive for sinus pain. Negative for congestion and sore throat.   Eyes:  Positive for blurred vision, photophobia, pain and discharge. Negative for redness.  Respiratory:  Negative for cough and sputum production.   Gastrointestinal:  Positive for nausea.  Neurological:  Positive for headaches.      Objective:     BP (!) 155/104   Pulse 85   Temp 98.2 F (36.8 C) (Oral)   Ht 6' 1 (1.854 m)   Wt 107.8 kg   SpO2 98%   BMI 31.37 kg/m  BP Readings from Last 3 Encounters:  05/10/24 (!) 155/104  04/25/24 (!) 150/90  04/19/24 (!) 150/110      Physical Exam Constitutional:      Appearance: Normal appearance.  HENT:     Ears:     Comments: BL tympanic sclerosis    Nose: Nose normal.     Mouth/Throat:     Mouth: Mucous membranes are dry.  Eyes:     General:        Right eye: No discharge.        Left eye: Discharge present.    Extraocular Movements: Extraocular movements intact.     Conjunctiva/sclera: Conjunctivae normal.     Pupils: Pupils are equal, round, and  reactive to light.      Comments: Erythema, TTP, and swelling localized to left upper eyelid, on lateral border, could be beginning stages of a stye.   Cardiovascular:     Rate and Rhythm: Normal rate and regular rhythm.     Pulses: Normal pulses.     Heart sounds: Normal heart sounds.  Musculoskeletal:     Cervical back: Normal range of motion and neck supple.  Neurological:     Mental Status: He is alert.      No results found for any visits on 05/10/24.     The 10-year ASCVD risk score (Arnett DK, et al., 2019) is: 2.6%    Assessment & Plan:   Problem List Items Addressed This Visit   None Visit Diagnoses       Tension headache    -  Primary     Left eye pain         Acute non-recurrent frontal sinusitis       Relevant Medications   amoxicillin -clavulanate (AUGMENTIN ) 875-125 MG tablet     Tension Headache Left eye pain  Acute sinusitis- non-recurrent - Start Augmentin , prescription sent to pharmacy  - Migraine cocktail given in office (Toradol  and  Decadron ) - Discussed using warm compresses on left eye to help with pain/ swelling   Hypertension  - BP: 155/104; BP recheck was roughly the same, likely elevated due to pain   Return if symptoms worsen or fail to improve.    Tinnie FORBES Patient, Student-PA

## 2024-05-11 ENCOUNTER — Encounter: Payer: Self-pay | Admitting: Physician Assistant

## 2024-05-20 LAB — LAB REPORT - SCANNED

## 2024-05-25 ENCOUNTER — Inpatient Hospital Stay: Payer: MEDICAID | Admitting: Physician Assistant

## 2024-05-25 DIAGNOSIS — F1010 Alcohol abuse, uncomplicated: Secondary | ICD-10-CM

## 2024-06-02 ENCOUNTER — Telehealth: Payer: Self-pay | Admitting: Physician Assistant

## 2024-06-02 NOTE — Telephone Encounter (Signed)
 Copied from CRM [id number]. Topic: Referral - Request for Referral >> Jun 02, 2024 11:41 AM Mace SQUIBB wrote: Did the patient discuss referral with their provider in the last year? No (If No - schedule appointment) (If Yes - send message)  Appointment offered? No  Type of order/referral and detailed reason for visit: Spine Specialist for broken neck  Preference of office, provider, location: [place]. [facility] spine specialist   If referral order, have you been seen by this specialty before? No (If Yes, this issue or another issue? When? Where?  Can we respond through MyChart? Yes

## 2024-06-03 NOTE — Telephone Encounter (Signed)
 When he says spine specialist is he talking about an orthopedist?  Just want to clarify I do not mind putting it in and it looks like he does have some cervical stenosis.  If he is talking about a neurosurgeon then he would have to come in and be reevaluated as they usually require up-to-date imaging etc.

## 2024-06-10 ENCOUNTER — Other Ambulatory Visit: Payer: Self-pay | Admitting: Physician Assistant

## 2024-06-10 DIAGNOSIS — Z0289 Encounter for other administrative examinations: Secondary | ICD-10-CM

## 2024-06-10 DIAGNOSIS — G894 Chronic pain syndrome: Secondary | ICD-10-CM

## 2024-06-10 DIAGNOSIS — M4802 Spinal stenosis, cervical region: Secondary | ICD-10-CM

## 2024-06-10 DIAGNOSIS — S46012D Strain of muscle(s) and tendon(s) of the rotator cuff of left shoulder, subsequent encounter: Secondary | ICD-10-CM

## 2024-06-10 DIAGNOSIS — M47812 Spondylosis without myelopathy or radiculopathy, cervical region: Secondary | ICD-10-CM

## 2024-06-10 NOTE — Telephone Encounter (Signed)
 Confirm the location that he is desiring, [place]?

## 2024-06-13 ENCOUNTER — Telehealth: Payer: Self-pay

## 2024-06-13 ENCOUNTER — Encounter: Payer: Self-pay | Admitting: Physician Assistant

## 2024-06-13 ENCOUNTER — Ambulatory Visit (INDEPENDENT_AMBULATORY_CARE_PROVIDER_SITE_OTHER): Payer: MEDICAID | Admitting: Physician Assistant

## 2024-06-13 VITALS — BP 150/99 | HR 106 | Ht 73.0 in | Wt 239.0 lb

## 2024-06-13 DIAGNOSIS — M47812 Spondylosis without myelopathy or radiculopathy, cervical region: Secondary | ICD-10-CM | POA: Diagnosis not present

## 2024-06-13 DIAGNOSIS — F250 Schizoaffective disorder, bipolar type: Secondary | ICD-10-CM

## 2024-06-13 DIAGNOSIS — M4802 Spinal stenosis, cervical region: Secondary | ICD-10-CM

## 2024-06-13 DIAGNOSIS — F1090 Alcohol use, unspecified, uncomplicated: Secondary | ICD-10-CM | POA: Diagnosis not present

## 2024-06-13 DIAGNOSIS — Z0289 Encounter for other administrative examinations: Secondary | ICD-10-CM

## 2024-06-13 DIAGNOSIS — G894 Chronic pain syndrome: Secondary | ICD-10-CM | POA: Diagnosis not present

## 2024-06-13 DIAGNOSIS — I10 Essential (primary) hypertension: Secondary | ICD-10-CM

## 2024-06-13 DIAGNOSIS — S46012D Strain of muscle(s) and tendon(s) of the rotator cuff of left shoulder, subsequent encounter: Secondary | ICD-10-CM

## 2024-06-13 MED ORDER — MELOXICAM 15 MG PO TABS: 15.0000 mg | ORAL_TABLET | Freq: Every day | ORAL | 1 refills | Status: DC

## 2024-06-13 MED ORDER — TRAMADOL HCL 50 MG PO TABS: 50.0000 mg | ORAL_TABLET | Freq: Three times a day (TID) | ORAL | 2 refills | Status: DC

## 2024-06-13 NOTE — Patient Instructions (Addendum)
 Increase risperdial to 2mg  twice a day Continue mobic  Tramadol  3 times a day for pain Increase amlodapine to 5mg  daily

## 2024-06-13 NOTE — Telephone Encounter (Signed)
 Patient sent Mychart message with notification that prescription refill has been sent to pharmacy

## 2024-06-13 NOTE — Telephone Encounter (Signed)
 Copied from CRM [id number]. Topic: Clinical - Prescription Issue >> Jun 13, 2024  3:49 PM Thomas Jennings wrote: Patient Thomas Jennings calling asking if provider would sign off today for him to get his medication: TraMADol  (ULTRAM ) 50 MG tablet   Patient said he was told by pharmacy if the provider okayed they would fill it today.   [id number] [name], [place] - [address], [place] [address] Phone: [phone]  Fax: [phone]   Please advise

## 2024-06-13 NOTE — Telephone Encounter (Signed)
 Patient calling back regarding this prescription. He stated that he was told by the pharmacy that the prescribing provider has to call the pharmacy to approve the order for this prescription.

## 2024-06-13 NOTE — Progress Notes (Signed)
 Established Patient Office Visit  Subjective   Patient ID: Thomas Jennings, male    DOB: Nov 25, 1980  Age: 43 y.o. MRN: [id number]  Chief Complaint  Patient presents with   Hospitalization Follow-up    HPI Pt is a 43 yo male who presents to the clinic for hospital follow up and medication management.   He was last in hospital on 05/31/2024 after stopping drinking alcohol  on 7/28 and having thoughts that he would be better off dead and very agitated. He was cleared by Psychiatry team and discharged. He is supposed to follow up with [facility] in [place] for extensive counseling. He plans on going twice a week to counseling.   He feels better. He has part time job at [employer]. He lives with roommate in [place]. He has had no alcohol  since 7/28. He is going to the [employer] with a friend of his and has been really good for him. He continues to take risperidone . He just increased to the 2mg  bid dosing.   He is struggling with a lot of pain in neck and both arms. Pt has hx of cervical spinal stenosis and spondylosis. He has not seen anyone recently for this. His job is painful and really hard to continue to work in this pain. At the end of the day pain shoots down his arms from his neck. He would like refills on tramadol . He would like something in [place] or [place].   ROS See HPI.    Objective:     BP (!) 150/99   Pulse (!) 106   Ht 6' 1 (1.854 m)   Wt 239 lb (108.4 kg)   SpO2 99%   BMI 31.53 kg/m  BP Readings from Last 3 Encounters:  06/13/24 (!) 150/99  05/10/24 (!) 155/104  04/25/24 (!) 150/90   Wt Readings from Last 3 Encounters:  06/13/24 239 lb (108.4 kg)  05/10/24 237 lb 12 oz (107.8 kg)  04/25/24 239 lb (108.4 kg)      Physical Exam Constitutional:      Appearance: Normal appearance.  HENT:     Head: Normocephalic.  Cardiovascular:     Rate and Rhythm: Normal rate and regular rhythm.  Pulmonary:     Effort: Pulmonary effort is normal.  Musculoskeletal:     Comments: NROM of  neck.  Tenderness to palpation over cervical spine.  Well healed scar over cervical spine.  NROM of bilateral shoulders Strength decreased 4/5 bilateral upper ext.   Neurological:     General: No focal deficit present.     Mental Status: He is alert and oriented to person, place, and time.  Psychiatric:        Mood and Affect: Mood normal.      The 10-year ASCVD risk score (Arnett DK, et al., 2019) is: 2.5%    Assessment & Plan:  SABRASABRAAvi was seen today for hospitalization follow-up.  Diagnoses and all orders for this visit:  Chronic pain syndrome -     traMADol  (ULTRAM ) 50 MG tablet; Take 1 tablet (50 mg total) by mouth every 8 (eight) hours. -     Ambulatory referral to Orthopedics  Cervical spinal stenosis -     meloxicam  (MOBIC ) 15 MG tablet; Take 1 tablet (15 mg total) by mouth daily. -     traMADol  (ULTRAM ) 50 MG tablet; Take 1 tablet (50 mg total) by mouth every 8 (eight) hours. -     Ambulatory referral to Orthopedics  Cervical spondylosis without myelopathy -  traMADol  (ULTRAM ) 50 MG tablet; Take 1 tablet (50 mg total) by mouth every 8 (eight) hours. -     Ambulatory referral to Orthopedics  Alcohol  use disorder   BP not to goal Continue hyzaar 100/25mg  daily and add norvasc  2.5mg  daily Follow up in one month  Increase to Risperdal  2mg  bid for mood Continue to get counseling through [facility]  Continue lyrica  and mobic  for chronic neck pain Referral made for ortho I was willing to give one month of tramadol  and close monitoring to help with pain  I mislooked at last fill date and called by pharmacy he got 270 tablets 6/12 therefore he cannot get tramadol  again until 9/11  Strongly urged patient to not take ore than 3 times a day and do not drink alcohol  with tramadol . If he continues to drink will stop prescribing until negative alcohol  screening.  SABRASABRAPDMP reviewed during this encounter. Pt is aware and agrees to not drink alcohol  while taking  tramadol    Vermell Bologna, PA-C

## 2024-06-14 ENCOUNTER — Encounter: Payer: Self-pay | Admitting: Physician Assistant

## 2024-06-14 DIAGNOSIS — F1090 Alcohol use, unspecified, uncomplicated: Secondary | ICD-10-CM | POA: Insufficient documentation

## 2024-06-24 ENCOUNTER — Ambulatory Visit: Payer: MEDICAID | Admitting: Physician Assistant

## 2024-07-05 ENCOUNTER — Encounter: Payer: Self-pay | Admitting: Sports Medicine

## 2024-07-11 ENCOUNTER — Other Ambulatory Visit: Payer: Self-pay | Admitting: Physician Assistant

## 2024-07-11 ENCOUNTER — Ambulatory Visit (INDEPENDENT_AMBULATORY_CARE_PROVIDER_SITE_OTHER): Payer: MEDICAID

## 2024-07-11 ENCOUNTER — Ambulatory Visit (INDEPENDENT_AMBULATORY_CARE_PROVIDER_SITE_OTHER): Payer: MEDICAID | Admitting: Physician Assistant

## 2024-07-11 ENCOUNTER — Encounter: Payer: Self-pay | Admitting: Physician Assistant

## 2024-07-11 VITALS — BP 164/100 | HR 88 | Ht 73.0 in | Wt 242.0 lb

## 2024-07-11 DIAGNOSIS — F1090 Alcohol use, unspecified, uncomplicated: Secondary | ICD-10-CM

## 2024-07-11 DIAGNOSIS — R1031 Right lower quadrant pain: Secondary | ICD-10-CM

## 2024-07-11 DIAGNOSIS — M47812 Spondylosis without myelopathy or radiculopathy, cervical region: Secondary | ICD-10-CM

## 2024-07-11 DIAGNOSIS — I10 Essential (primary) hypertension: Secondary | ICD-10-CM

## 2024-07-11 DIAGNOSIS — F1091 Alcohol use, unspecified, in remission: Secondary | ICD-10-CM

## 2024-07-11 DIAGNOSIS — H6121 Impacted cerumen, right ear: Secondary | ICD-10-CM | POA: Diagnosis not present

## 2024-07-11 DIAGNOSIS — G894 Chronic pain syndrome: Secondary | ICD-10-CM

## 2024-07-11 DIAGNOSIS — F250 Schizoaffective disorder, bipolar type: Secondary | ICD-10-CM

## 2024-07-11 DIAGNOSIS — R6889 Other general symptoms and signs: Secondary | ICD-10-CM

## 2024-07-11 DIAGNOSIS — M545 Low back pain, unspecified: Secondary | ICD-10-CM

## 2024-07-11 DIAGNOSIS — R1032 Left lower quadrant pain: Secondary | ICD-10-CM

## 2024-07-11 DIAGNOSIS — R200 Anesthesia of skin: Secondary | ICD-10-CM

## 2024-07-11 DIAGNOSIS — M4802 Spinal stenosis, cervical region: Secondary | ICD-10-CM

## 2024-07-11 DIAGNOSIS — Z0289 Encounter for other administrative examinations: Secondary | ICD-10-CM | POA: Diagnosis not present

## 2024-07-11 LAB — POCT RAPID STREP A (OFFICE): Rapid Strep A Screen: NEGATIVE

## 2024-07-11 LAB — POC SOFIA 2 FLU + SARS ANTIGEN FIA
Influenza A, POC: NEGATIVE
Influenza B, POC: NEGATIVE
SARS Coronavirus 2 Ag: NEGATIVE

## 2024-07-11 MED ORDER — AMLODIPINE BESYLATE 5 MG PO TABS: 5.0000 mg | ORAL_TABLET | Freq: Every day | ORAL | 1 refills | Status: DC

## 2024-07-11 NOTE — Progress Notes (Signed)
 Established Patient Office Visit  Subjective   Patient ID: Thomas Jennings, male    DOB: [birth year]  Age: 43 y.o. MRN: [id number]   HPI Pt is a 43 yo male who presents to the clinic today to discuss URI symptoms. He has been congested and felt like right ear clogged for a few days. Not had covid or flu vaccine this year. No sick contacts. Not taking anything OTC.   He has been sober since August 28th, 2025.   He has some bilateral toe numbness intermittently that is worrisome. He has ongoing low back pain but no real radiation down legs. He does complain of bilateral groin or hip pain. No injury. He has not had tramadol  because not due for refill until 9/12.   He reports to be taking his medications daily especially for BP. He is checking his BP at home and all over the place but he does get readings in the 120s over 70s at times.    ROS See HPI.    Objective:     BP (!) 164/100   Pulse 88   Ht 6' 1 (1.854 m)   Wt 242 lb (109.8 kg)   SpO2 99%   BMI 31.93 kg/m  BP Readings from Last 3 Encounters:  07/11/24 (!) 164/100  06/13/24 (!) 150/99  05/10/24 (!) 155/104   Wt Readings from Last 3 Encounters:  07/11/24 242 lb (109.8 kg)  06/13/24 239 lb (108.4 kg)  05/10/24 237 lb 12 oz (107.8 kg)    .SABRA Results for orders placed or performed in visit on 07/11/24  POC SOFIA 2 FLU + SARS ANTIGEN FIA   Collection Time: 07/11/24  1:41 PM  Result Value Ref Range   Influenza A, POC Negative Negative   Influenza B, POC Negative Negative   SARS Coronavirus 2 Ag Negative Negative  POCT rapid strep A   Collection Time: 07/11/24  1:41 PM  Result Value Ref Range   Rapid Strep A Screen Negative Negative  B12 and Folate Panel   Collection Time: 07/11/24  1:56 PM  Result Value Ref Range   Vitamin B-12 405 232 - 1,245 pg/mL   Folate 4.0 >3.0 ng/mL  Vitamin B1   Collection Time: 07/11/24  1:56 PM  Result Value Ref Range   Thiamine 113.9 66.5 - 200.0 nmol/L  CMP14+EGFR   Collection  Time: 07/11/24  1:56 PM  Result Value Ref Range   Glucose 108 (H) 70 - 99 mg/dL   BUN 9 6 - 24 mg/dL   Creatinine, Ser 9.19 0.76 - 1.27 mg/dL   eGFR 888 >40 fO/fpw/8.26   BUN/Creatinine Ratio 11 9 - 20   Sodium 141 134 - 144 mmol/L   Potassium 3.7 3.5 - 5.2 mmol/L   Chloride 102 96 - 106 mmol/L   CO2 24 20 - 29 mmol/L   Calcium 9.2 8.7 - 10.2 mg/dL   Total Protein 6.9 6.0 - 8.5 g/dL   Albumin 4.6 4.1 - 5.1 g/dL   Globulin, Total 2.3 1.5 - 4.5 g/dL   Bilirubin Total 0.4 0.0 - 1.2 mg/dL   Alkaline Phosphatase 117 44 - 121 IU/L   AST 87 (H) 0 - 40 IU/L   ALT 97 (H) 0 - 44 IU/L  CBC w/Diff/Platelet   Collection Time: 07/11/24  1:56 PM  Result Value Ref Range   WBC 4.8 3.4 - 10.8 x10E3/uL   RBC 4.46 4.14 - 5.80 x10E6/uL   Hemoglobin 14.2 13.0 - 17.7 g/dL   Hematocrit  42.6 37.5 - 51.0 %   MCV 96 79 - 97 fL   MCH 31.8 26.6 - 33.0 pg   MCHC 33.3 31.5 - 35.7 g/dL   RDW 85.2 88.3 - 84.5 %   Platelets 257 150 - 450 x10E3/uL   Neutrophils 65 Not Estab. %   Lymphs 19 Not Estab. %   Monocytes 11 Not Estab. %   Eos 2 Not Estab. %   Basos 2 Not Estab. %   Neutrophils Absolute 3.1 1.4 - 7.0 x10E3/uL   Lymphocytes Absolute 0.9 0.7 - 3.1 x10E3/uL   Monocytes Absolute 0.5 0.1 - 0.9 x10E3/uL   EOS (ABSOLUTE) 0.1 0.0 - 0.4 x10E3/uL   Basophils Absolute 0.1 0.0 - 0.2 x10E3/uL   Immature Granulocytes 0 Not Estab. %   Immature Grans (Abs) 0.0 0.0 - 0.1 x10E3/uL  Ferritin   Collection Time: 07/11/24  1:56 PM  Result Value Ref Range   Ferritin 418 (H) 30 - 400 ng/mL  Iron   Collection Time: 07/11/24  1:56 PM  Result Value Ref Range   Iron 96 38 - 169 ug/dL  TSH + free T4   Collection Time: 07/11/24  1:56 PM  Result Value Ref Range   TSH 1.090 0.450 - 4.500 uIU/mL   Free T4 1.29 0.82 - 1.77 ng/dL   .SABRACerumen Removal Template: Indication: Cerumen impaction of the ear(s) Medical necessity statement: On physical examination, cerumen impairs clinically significant portions of the external  auditory canal, and tympanic membrane. Noted obstructive, copious cerumen that cannot be removed without magnification and instrumentations requiring physician skills Consent: Discussed benefits and risks of procedure and verbal consent obtained Procedure: Patient was prepped for the procedure. Utilized an otoscope to assess and take note of the ear canal, the tympanic membrane, and the presence, amount, and placement of the cerumen. Gentle water irrigation and soft plastic curette was utilized to remove cerumen.  Post procedure examination shows cerumen was completely removed. Patient tolerated procedure well. The patient is made aware that they may experience temporary vertigo, temporary hearing loss, and temporary discomfort. If these symptom last for more than 24 hours to call the clinic or proceed to the ED.   Physical Exam Constitutional:      Appearance: Normal appearance. He is obese.  HENT:     Head: Normocephalic.     Right Ear: There is impacted cerumen.     Left Ear: Tympanic membrane, ear canal and external ear normal. There is no impacted cerumen.  Cardiovascular:     Rate and Rhythm: Normal rate and regular rhythm.  Pulmonary:     Breath sounds: Normal breath sounds.  Abdominal:     General: There is no distension.     Palpations: Abdomen is soft. There is no mass.     Tenderness: There is no abdominal tenderness. There is no right CVA tenderness, left CVA tenderness, guarding or rebound.     Hernia: No hernia is present.  Musculoskeletal:     Comments: NROM of bilateral hips 5/5 strength of lower extremity No pain over greater trochanter to palpation bilaterally.  Negative SLR, bilaterally.    Neurological:     General: No focal deficit present.     Mental Status: He is alert and oriented to person, place, and time.  Psychiatric:        Mood and Affect: Mood normal.       The 10-year ASCVD risk score (Arnett DK, et al., 2019) is: 2.9%    Assessment & Plan:   SABRASABRA  Marlen was seen today for medical management of chronic issues.  Diagnoses and all orders for this visit:  Flu-like symptoms -     POC SOFIA 2 FLU + SARS ANTIGEN FIA -     POCT rapid strep A  Pain management contract agreement  Chronic pain syndrome -     Ambulatory referral to Orthopedic Surgery  Alcohol  use disorder in remission  Essential hypertension -     amLODipine  (NORVASC ) 5 MG tablet; Take 1 tablet (5 mg total) by mouth daily. -     CMP14+EGFR  Schizoaffective disorder, bipolar type (HCC)  Numbness of toes -     B12 and Folate Panel -     Vitamin B1 -     CMP14+EGFR -     CBC w/Diff/Platelet -     Ferritin -     Iron -     TSH + free T4 -     DG HIP UNILAT W OR W/O PELVIS 2-3 VIEWS LEFT; Future -     DG HIP UNILAT W OR W/O PELVIS 2-3 VIEWS RIGHT; Future -     DG Lumbar Spine Complete; Future  Bilateral groin pain -     DG HIP UNILAT W OR W/O PELVIS 2-3 VIEWS LEFT; Future -     DG HIP UNILAT W OR W/O PELVIS 2-3 VIEWS RIGHT; Future -     DG Lumbar Spine Complete; Future  Acute bilateral low back pain without sciatica -     DG Lumbar Spine Complete; Future -     Ambulatory referral to Orthopedic Surgery  Right ear impacted cerumen  Cervical spondylosis without myelopathy -     Ambulatory referral to Orthopedic Surgery  Cervical spinal stenosis -     Ambulatory referral to Orthopedic Surgery   BP not to goal today Start norvasc  2.5mg  for BP control Could be due to viral infection Symptomatic care discuss Flu/covid/strep negative today Follow up if not improving or symptoms worsening  Unclear etiology of numbness of toes and bilateral groin pain Get lumbar and hip xrays  Get labs to evaluate paresthesias Referral to orthopedist today  Right ear cerumen removed today  Tramadol  should be picked up after 9/12 .SABRAPDMP reviewed during this encounter.   Continue with alcohol  sobriety, WAY to GO.   Follow up 2 weeks for nurse visit and BP  recheck.  Elkin Belfield, PA-C

## 2024-07-11 NOTE — Patient Instructions (Addendum)
 Increase amlodipine  to 5mg  daily Will get labs today Go downstairs and get imaging of low back and hips

## 2024-07-14 ENCOUNTER — Encounter: Payer: Self-pay | Admitting: Physician Assistant

## 2024-07-15 ENCOUNTER — Ambulatory Visit: Payer: Self-pay | Admitting: Physician Assistant

## 2024-07-15 DIAGNOSIS — R748 Abnormal levels of other serum enzymes: Secondary | ICD-10-CM

## 2024-07-15 DIAGNOSIS — M5136 Other intervertebral disc degeneration, lumbar region with discogenic back pain only: Secondary | ICD-10-CM

## 2024-07-15 NOTE — Progress Notes (Signed)
 Liver enzymes are still elevated. Need to get ultrasound of liver.

## 2024-07-15 NOTE — Telephone Encounter (Signed)
 8/11 they would not refill it because too soon. Do they need me to send again or can they fill the one on file?

## 2024-07-16 LAB — CMP14+EGFR
ALT: 97 IU/L — ABNORMAL HIGH (ref 0–44)
AST: 87 IU/L — ABNORMAL HIGH (ref 0–40)
Albumin: 4.6 g/dL (ref 4.1–5.1)
Alkaline Phosphatase: 117 IU/L (ref 44–121)
BUN/Creatinine Ratio: 11 (ref 9–20)
BUN: 9 mg/dL (ref 6–24)
Bilirubin Total: 0.4 mg/dL (ref 0.0–1.2)
CO2: 24 mmol/L (ref 20–29)
Calcium: 9.2 mg/dL (ref 8.7–10.2)
Chloride: 102 mmol/L (ref 96–106)
Creatinine, Ser: 0.8 mg/dL (ref 0.76–1.27)
Globulin, Total: 2.3 g/dL (ref 1.5–4.5)
Glucose: 108 mg/dL — ABNORMAL HIGH (ref 70–99)
Potassium: 3.7 mmol/L (ref 3.5–5.2)
Sodium: 141 mmol/L (ref 134–144)
Total Protein: 6.9 g/dL (ref 6.0–8.5)
eGFR: 111 mL/min/1.73 (ref >59)

## 2024-07-16 LAB — IRON: Iron: 96 ug/dL (ref 38–169)

## 2024-07-16 LAB — B12 AND FOLATE PANEL
Folate: 4 ng/mL (ref >3.0)
Vitamin B-12: 405 pg/mL (ref 232–1245)

## 2024-07-16 LAB — CBC WITH DIFFERENTIAL/PLATELET
Basophils Absolute: 0.1 x10E3/uL (ref 0.0–0.2)
Basos: 2 %
EOS (ABSOLUTE): 0.1 x10E3/uL (ref 0.0–0.4)
Eos: 2 %
Hematocrit: 42.6 % (ref 37.5–51.0)
Hemoglobin: 14.2 g/dL (ref 13.0–17.7)
Immature Grans (Abs): 0 x10E3/uL (ref 0.0–0.1)
Immature Granulocytes: 0 %
Lymphocytes Absolute: 0.9 x10E3/uL (ref 0.7–3.1)
Lymphs: 19 %
MCH: 31.8 pg (ref 26.6–33.0)
MCHC: 33.3 g/dL (ref 31.5–35.7)
MCV: 96 fL (ref 79–97)
Monocytes Absolute: 0.5 x10E3/uL (ref 0.1–0.9)
Monocytes: 11 %
Neutrophils Absolute: 3.1 x10E3/uL (ref 1.4–7.0)
Neutrophils: 65 %
Platelets: 257 x10E3/uL (ref 150–450)
RBC: 4.46 x10E6/uL (ref 4.14–5.80)
RDW: 14.7 % (ref 11.6–15.4)
WBC: 4.8 x10E3/uL (ref 3.4–10.8)

## 2024-07-16 LAB — FERRITIN: Ferritin: 418 ng/mL — ABNORMAL HIGH (ref 30–400)

## 2024-07-16 LAB — VITAMIN B1: Thiamine: 113.9 nmol/L (ref 66.5–200.0)

## 2024-07-16 LAB — TSH+FREE T4
Free T4: 1.29 ng/dL (ref 0.82–1.77)
TSH: 1.09 u[IU]/mL (ref 0.450–4.500)

## 2024-07-18 ENCOUNTER — Ambulatory Visit: Payer: MEDICAID

## 2024-07-18 ENCOUNTER — Encounter: Payer: Self-pay | Admitting: Physician Assistant

## 2024-07-18 DIAGNOSIS — R748 Abnormal levels of other serum enzymes: Secondary | ICD-10-CM | POA: Diagnosis not present

## 2024-07-18 NOTE — Progress Notes (Signed)
 Liver enzymes are still elevated. Ferritin is still high. Likely due to inflammation.  Will correlate to ultrasound done today.

## 2024-07-18 NOTE — Progress Notes (Signed)
Normal hip xrays

## 2024-07-19 ENCOUNTER — Ambulatory Visit: Payer: Self-pay | Admitting: Physician Assistant

## 2024-07-19 DIAGNOSIS — M51369 Other intervertebral disc degeneration, lumbar region without mention of lumbar back pain or lower extremity pain: Secondary | ICD-10-CM | POA: Insufficient documentation

## 2024-07-19 NOTE — Progress Notes (Signed)
 Disc space narrowing and arthritic changes at low spine but no acute worrisome findings.

## 2024-07-19 NOTE — Progress Notes (Signed)
 Fatty liver that could cause the elevated liver enzymes. Most important thing to stay away from alcohol . Then exercise at least 150 minutes a week. Then make diet changes:   Choose leaner proteins like  fish, poultry, beans, and nuts.  Limit red meats, cold cuts,  bacon and other processed  meats.  Aim to drink mostly water,  unsweetened tea or coffee. Avoid sugary beverages like  soda, juice, lemonade and  sports drinks.  Use olive or canola oil for  cooking and on salads. Limit  butter and avoid trans fat.

## 2024-07-25 ENCOUNTER — Ambulatory Visit: Payer: MEDICAID | Admitting: Physician Assistant

## 2024-08-02 ENCOUNTER — Other Ambulatory Visit: Payer: Self-pay | Admitting: Physician Assistant

## 2024-08-02 DIAGNOSIS — I10 Essential (primary) hypertension: Secondary | ICD-10-CM

## 2024-09-08 ENCOUNTER — Other Ambulatory Visit: Payer: Self-pay | Admitting: Physician Assistant

## 2024-09-08 DIAGNOSIS — K2100 Gastro-esophageal reflux disease with esophagitis, without bleeding: Secondary | ICD-10-CM

## 2024-10-05 ENCOUNTER — Ambulatory Visit (INDEPENDENT_AMBULATORY_CARE_PROVIDER_SITE_OTHER): Payer: MEDICAID | Admitting: Physician Assistant

## 2024-10-05 VITALS — BP 150/100 | HR 110 | Ht 73.0 in | Wt 241.0 lb

## 2024-10-05 DIAGNOSIS — Z0289 Encounter for other administrative examinations: Secondary | ICD-10-CM

## 2024-10-05 DIAGNOSIS — G894 Chronic pain syndrome: Secondary | ICD-10-CM

## 2024-10-05 DIAGNOSIS — F331 Major depressive disorder, recurrent, moderate: Secondary | ICD-10-CM

## 2024-10-05 DIAGNOSIS — F5101 Primary insomnia: Secondary | ICD-10-CM

## 2024-10-05 DIAGNOSIS — M4802 Spinal stenosis, cervical region: Secondary | ICD-10-CM | POA: Diagnosis not present

## 2024-10-05 DIAGNOSIS — I10 Essential (primary) hypertension: Secondary | ICD-10-CM

## 2024-10-05 DIAGNOSIS — M47812 Spondylosis without myelopathy or radiculopathy, cervical region: Secondary | ICD-10-CM | POA: Diagnosis not present

## 2024-10-05 DIAGNOSIS — F1011 Alcohol abuse, in remission: Secondary | ICD-10-CM

## 2024-10-05 MED ORDER — QUETIAPINE FUMARATE 25 MG PO TABS: ORAL_TABLET | ORAL | 2 refills | Status: DC

## 2024-10-05 MED ORDER — TRAMADOL HCL 50 MG PO TABS: 50.0000 mg | ORAL_TABLET | Freq: Three times a day (TID) | ORAL | 0 refills | Status: DC

## 2024-10-05 MED ORDER — MELOXICAM 15 MG PO TABS: 15.0000 mg | ORAL_TABLET | Freq: Every day | ORAL | 1 refills | Status: AC

## 2024-10-05 MED ORDER — AMLODIPINE BESYLATE 10 MG PO TABS: 10.0000 mg | ORAL_TABLET | Freq: Every day | ORAL | 1 refills | Status: AC

## 2024-10-05 MED ORDER — DULOXETINE HCL 60 MG PO CPEP: 60.0000 mg | ORAL_CAPSULE | Freq: Two times a day (BID) | ORAL | 1 refills | Status: AC

## 2024-10-05 MED ORDER — PREGABALIN 150 MG PO CAPS: 150.0000 mg | ORAL_CAPSULE | Freq: Two times a day (BID) | ORAL | 1 refills | Status: AC

## 2024-10-05 NOTE — Patient Instructions (Signed)
 Increase norvasc  to 10mg  daily, continue hyzaar.  Nurse visit recheck in 2 weeks.   Will make referral to pain clinic Stay on same medications for now  Seroquel  to start for sleep and mood

## 2024-10-05 NOTE — Progress Notes (Signed)
 Established Patient Office Visit  Subjective   Patient ID: Thomas Jennings, male    DOB: 07/21/1981  Age: 43 y.o. MRN: [id number]  Chief Complaint  Patient presents with   Medical Management of Chronic Issues    HPI .SABRADiscussed the use of AI scribe software for clinical note transcription with the patient, who gave verbal consent to proceed.  History of Present Illness Thomas Jennings is a 43 year old male with cervical spinal stenosis who presents for follow-up on chronic pain management and recent neurology visit.  Cervical spinal stenosis and chronic pain - History of cervical spinal stenosis with C3-C7 laminectomy and fusion in 2024. - Persistent chronic pain despite surgery, with pain most severe at night. - Current pain management regimen includes Lyrica  twice daily, Mobic , meloxicam , duloxetine , and tramadol . - Tramadol  provides more effective pain relief than previously used stronger medications. - Requests refill for meloxicam . - Recent neurology consultation indicated no further surgical options; recommended physical therapy for six weeks due to muscle weakness.  Sleep disturbance - Significant difficulty sleeping, worsened by upcoming third shift job. - Racing thoughts at night prevent sleep. - Uses earplugs and sleep mask to aid sleep. - Quetiapine  was previously effective for sleep by quieting his mind.  Occupational functioning - Preparing to start a third shift job, which may better align with current sleep pattern. - Concerned about ability to sleep during the day. - Plans to work part-time, averaging 30-32 hours per week, due to physical limitations from chronic pain. - Previous job was physically demanding and not feasible given current condition.  Hypertension - Checks blood pressure sporadically at home; consistently elevated above threshold. - Currently taking Norvasc  (amlodipine ) for blood pressure control.  Psychiatric symptoms and medication  adherence - Has not been taking risperidone  due to perceived ineffectiveness of [facility]-supplied medication compared to hospital-supplied medication.  Alcohol  use disorder - Sober since July 27th. - Motivated to maintain sobriety, especially while on pain contracts and for his daughter.    ROS See HPI.    Objective:     BP (!) 150/100   Pulse (!) 110   Ht 6' 1 (1.854 m)   Wt 241 lb (109.3 kg)   SpO2 99%   BMI 31.80 kg/m  BP Readings from Last 3 Encounters:  10/05/24 (!) 150/100  07/11/24 (!) 164/100  06/13/24 (!) 150/99   Wt Readings from Last 3 Encounters:  10/05/24 241 lb (109.3 kg)  07/11/24 242 lb (109.8 kg)  06/13/24 239 lb (108.4 kg)      Physical Exam Constitutional:      Appearance: Normal appearance.  HENT:     Head: Normocephalic.  Cardiovascular:     Rate and Rhythm: Normal rate and regular rhythm.  Pulmonary:     Effort: Pulmonary effort is normal.     Breath sounds: Normal breath sounds.  Musculoskeletal:     Right lower leg: No edema.     Left lower leg: No edema.  Neurological:     General: No focal deficit present.     Mental Status: He is alert and oriented to person, place, and time.  Psychiatric:        Mood and Affect: Mood normal.      The 10-year ASCVD risk score (Arnett DK, et al., 2019) is: 2.5%    Assessment & Plan:  .Thomas Jennings was seen today for medical management of chronic issues.  Diagnoses and all orders for this visit:  Chronic pain syndrome -  pregabalin  (LYRICA ) 150 MG capsule; Take 1 capsule (150 mg total) by mouth 2 (two) times daily. -     DULoxetine  (CYMBALTA ) 60 MG capsule; Take 1 capsule (60 mg total) by mouth 2 (two) times daily. -     traMADol  (ULTRAM ) 50 MG tablet; Take 1 tablet (50 mg total) by mouth every 8 (eight) hours. -     Ambulatory referral to Pain Clinic  Cervical spinal stenosis -     pregabalin  (LYRICA ) 150 MG capsule; Take 1 capsule (150 mg total) by mouth 2 (two) times daily. -     DULoxetine   (CYMBALTA ) 60 MG capsule; Take 1 capsule (60 mg total) by mouth 2 (two) times daily. -     traMADol  (ULTRAM ) 50 MG tablet; Take 1 tablet (50 mg total) by mouth every 8 (eight) hours. -     meloxicam  (MOBIC ) 15 MG tablet; Take 1 tablet (15 mg total) by mouth daily. -     Ambulatory referral to Pain Clinic  Pain management contract agreement -     pregabalin  (LYRICA ) 150 MG capsule; Take 1 capsule (150 mg total) by mouth 2 (two) times daily. -     DULoxetine  (CYMBALTA ) 60 MG capsule; Take 1 capsule (60 mg total) by mouth 2 (two) times daily. -     Ambulatory referral to Pain Clinic  Cervical spondylosis without myelopathy -     pregabalin  (LYRICA ) 150 MG capsule; Take 1 capsule (150 mg total) by mouth 2 (two) times daily. -     DULoxetine  (CYMBALTA ) 60 MG capsule; Take 1 capsule (60 mg total) by mouth 2 (two) times daily. -     traMADol  (ULTRAM ) 50 MG tablet; Take 1 tablet (50 mg total) by mouth every 8 (eight) hours. -     Ambulatory referral to Pain Clinic  Moderate episode of recurrent major depressive disorder (HCC) -     QUEtiapine  (SEROQUEL ) 25 MG tablet; Take one tablet for three days at bedtime then increase to 2 tablets at bedtime. -     DULoxetine  (CYMBALTA ) 60 MG capsule; Take 1 capsule (60 mg total) by mouth 2 (two) times daily.  Alcohol  abuse, in remission  Primary hypertension -     amLODipine  (NORVASC ) 10 MG tablet; Take 1 tablet (10 mg total) by mouth daily.  Primary insomnia -     QUEtiapine  (SEROQUEL ) 25 MG tablet; Take one tablet for three days at bedtime then increase to 2 tablets at bedtime.    Assessment & Plan Cervical spinal stenosis with chronic pain Chronic pain due to cervical spinal stenosis post C3-C7 laminectomy. No further surgical options per neurology. Muscle weakness noted, potential benefit from physical therapy. Chronic pain may persist. - Ordered physical therapy for six weeks, twice a week. - Referred to pain clinic for further pain management  options. - Continue Lyrica , Mobic , duloxetine , and tramadol . - Refilled meloxicam  prescription. ..PDMP reviewed during this encounter.   Insomnia Chronic insomnia with difficulty initiating and maintaining sleep. Quetiapine  previously effective. Concerns about sleep disruption with third shift job. - Prescribed quetiapine  to be taken in the morning before sleep. - Advised on sleep hygiene: use light blinders, keep room cool, use earplugs and sleep mask.  Hypertension Blood pressure consistently above threshold. Currently on amlodipine . - Increased Norvasc  to 10 mg daily. - Rechecked blood pressure and did not improve significantly.  - Nurse visit BP check in 2 weeks.   Alcohol  use disorder, in remission Alcohol  use disorder in remission since July 27th. He is  aware of the importance of maintaining sobriety. - Continue to support sobriety efforts and monitor for any relapse.    Denijah Karrer, PA-C

## 2024-10-10 ENCOUNTER — Encounter: Payer: Self-pay | Admitting: Physician Assistant

## 2024-10-17 ENCOUNTER — Telehealth: Payer: Self-pay

## 2024-10-17 NOTE — Telephone Encounter (Signed)
 Copied from CRM #[id number]. Topic: Clinical - Medical Advice >> Oct 17, 2024  8:23 AM Rosaria A wrote: Reason for CRM: Patient had to cancel his bp check appointment for 10/19/2024. Patient is wanting to know if he can take his bp at home and send it to PCP on mychart. Please advise.

## 2024-10-17 NOTE — Telephone Encounter (Signed)
 Anything would be helpful if we need to re-adjust and then I can tell you when to reschedule.

## 2024-10-17 NOTE — Telephone Encounter (Signed)
Left message advising of recommendations.  

## 2024-10-19 ENCOUNTER — Ambulatory Visit: Payer: MEDICAID

## 2024-10-28 ENCOUNTER — Ambulatory Visit
Admission: EM | Admit: 2024-10-28 | Discharge: 2024-10-28 | Disposition: A | Discharge status: Home / Self Care | Payer: MEDICAID | Attending: Family Medicine | Admitting: Family Medicine

## 2024-10-28 ENCOUNTER — Encounter: Payer: Self-pay | Admitting: Emergency Medicine

## 2024-10-28 DIAGNOSIS — R059 Cough, unspecified: Secondary | ICD-10-CM

## 2024-10-28 DIAGNOSIS — J101 Influenza due to other identified influenza virus with other respiratory manifestations: Secondary | ICD-10-CM | POA: Diagnosis not present

## 2024-10-28 MED ORDER — PREDNISONE 20 MG PO TABS: ORAL_TABLET | ORAL | 0 refills | Status: DC

## 2024-10-28 MED ORDER — PROMETHAZINE-DM 6.25-15 MG/5ML PO SYRP: 5.0000 mL | ORAL_SOLUTION | Freq: Two times a day (BID) | ORAL | 0 refills | Status: DC | PRN

## 2024-10-28 MED ORDER — OSELTAMIVIR PHOSPHATE 75 MG PO CAPS: 75.0000 mg | ORAL_CAPSULE | Freq: Two times a day (BID) | ORAL | 0 refills | Status: DC

## 2024-10-28 NOTE — ED Triage Notes (Signed)
 Patient states that he tested positive influenza A on 10/25/2024 w/another urgent care.  Patient was not prescribed Tamiflu .  Sx's have since worsened, severe body pain, cough and chest congestion.  Patient has taken Mucinex.

## 2024-10-28 NOTE — Discharge Instructions (Addendum)
 Advised patient take medications as directed with food to completion.  Advised take prednisone  with first dose of Tamiflu  for the next 5 days.  Advised may use Promethazine  DM cough syrup at night prior to sleep for cough due to sedative effects.  Encouraged increase daily water intake to 64 ounces per day while taking these medications.  Advised if symptoms worsen and/or unresolved please follow-up with PCP or here for further evaluation.

## 2024-10-28 NOTE — ED Provider Notes (Signed)
 " [facility]    CSN: [id number] Arrival date & time: 10/28/24  1719      History   Chief Complaint Chief Complaint  Patient presents with   Bodyaches    HPI Thomas Jennings is a 43 y.o. male.   HPI 43 year old male presents with influenza-like illness of bodyaches, fever and cough for 3 days.  Patient reports testing positive for influenza A on 10/25/2024 and was not treated with Tamiflu .  PMH significant for HTN, diverticulitis, and chronic pain syndrome  Past Medical History:  Diagnosis Date   Cervical spondylosis with myelopathy and radiculopathy 03/09/2017   GAD (generalized anxiety disorder) 03/09/2017   Primary insomnia 03/09/2017   SOB (shortness of breath) 03/09/2017    Patient Active Problem List   Diagnosis Date Noted   DDD (degenerative disc disease), lumbar 07/19/2024   Numbness of toes 07/11/2024   Bilateral groin pain 07/11/2024   Alcohol  use disorder 06/14/2024   Schizoaffective disorder, bipolar type (HCC) 04/25/2024   Hematochezia 01/19/2024   Melena 01/19/2024   Hearing loss of right ear 01/19/2024   Right ear impacted cerumen 01/19/2024   Alcohol  induced fatty liver 01/18/2024   Nausea and vomiting 07/30/2023   Pharyngoesophageal dysphagia 07/30/2023   Alcohol  abuse, in remission 07/30/2023   Ringworm of body 07/30/2023   Pain management contract agreement 07/17/2023   Abnormal weight gain 07/15/2023   Alcoholism in remission (HCC) 06/30/2023   Traumatic tear of left rotator cuff 06/29/2023   Acute pain of left shoulder 06/29/2023   Alcohol  abuse 11/10/2022   Right hip pain 03/20/2022   Vitamin D  insufficiency 03/19/2022   Elevated hemoglobin 03/19/2022   Hypertriglyceridemia 03/19/2022   Chronic cough 03/18/2022   Suspected COVID-19 virus infection 12/30/2021   Stuttering 12/30/2021   History of URI (upper respiratory infection) 12/30/2021   Insomnia due to mental disorder 08/01/2021   Poor concentration 03/18/2021   Bilateral  hearing loss 03/18/2021   Elevated ALT measurement 10/16/2020   Left lateral epicondylitis 08/09/2019   Pancreatic mass 06/29/2019   Lower abdominal pain 06/29/2019   Lipoma of torso 10/21/2018   Blood in stool 10/21/2018   Chronic pain syndrome 07/16/2018   Drug-induced erectile dysfunction 04/23/2018   Carpal tunnel syndrome, bilateral 02/02/2018   History of diverticulitis 08/05/2017   Atypical chest pain 08/05/2017   Gastroesophageal reflux disease with esophagitis 08/05/2017   Moderate episode of recurrent major depressive disorder (HCC) 08/05/2017   Primary hypertension 05/11/2017   Cervical spinal stenosis 03/09/2017   Elevated blood pressure reading 03/09/2017   SOB (shortness of breath) 03/09/2017   GAD (generalized anxiety disorder) 03/09/2017   Insomnia 03/09/2017   Cervical spondylosis without myelopathy 02/22/2013    History reviewed. No pertinent surgical history.     Home Medications    Prior to Admission medications  Medication Sig Start Date End Date Taking? Authorizing Provider  amLODipine  (NORVASC ) 10 MG tablet Take 1 tablet (10 mg total) by mouth daily. 10/05/24  Yes Breeback, Jade L, PA-C  DULoxetine  (CYMBALTA ) 60 MG capsule Take 1 capsule (60 mg total) by mouth 2 (two) times daily. 10/05/24  Yes Breeback, Jade L, PA-C  losartan -hydrochlorothiazide (HYZAAR) 100-25 MG tablet Take 1 tablet by mouth daily. 04/25/24  Yes Breeback, Jade L, PA-C  meloxicam  (MOBIC ) 15 MG tablet Take 1 tablet (15 mg total) by mouth daily. 10/05/24  Yes Breeback, Jade L, PA-C  omeprazole  (PRILOSEC) 40 MG capsule TAKE 1 CAPSULE (40 MG TOTAL) BY MOUTH IN THE MORNING AND AT BEDTIME.  09/09/24  Yes Breeback, Jade L, PA-C  oseltamivir  (TAMIFLU ) 75 MG capsule Take 1 capsule (75 mg total) by mouth every 12 (twelve) hours. 10/28/24  Yes Teddy Sharper, FNP  predniSONE  (DELTASONE ) 20 MG tablet Take 3 tabs PO daily x 5 days. 10/28/24  Yes Teddy Sharper, FNP  pregabalin  (LYRICA ) 150 MG capsule Take  1 capsule (150 mg total) by mouth 2 (two) times daily. 10/05/24  Yes Breeback, Jade L, PA-C  promethazine -dextromethorphan (PROMETHAZINE -DM) 6.25-15 MG/5ML syrup Take 5 mLs by mouth 2 (two) times daily as needed for cough. 10/28/24  Yes Teddy Sharper, FNP  QUEtiapine  (SEROQUEL ) 25 MG tablet Take one tablet for three days at bedtime then increase to 2 tablets at bedtime. 10/05/24  Yes Breeback, Jade L, PA-C  traMADol  (ULTRAM ) 50 MG tablet Take 1 tablet (50 mg total) by mouth every 8 (eight) hours. 10/05/24  Yes Antoniette Vermell CROME, PA-C    Family History Family History  Problem Relation Age of Onset   Dementia Father    Alcohol  abuse Father    Mental illness Mother     Social History Social History[1]   Allergies   Gabapentin  and Lisinopril    Review of Systems Review of Systems  Constitutional:  Positive for fever.  Respiratory:  Positive for cough.   Musculoskeletal:  Positive for arthralgias and myalgias.  All other systems reviewed and are negative.    Physical Exam Triage Vital Signs ED Triage Vitals  Encounter Vitals Group     BP 10/28/24 1913 (!) 150/102     Girls Systolic BP Percentile --      Girls Diastolic BP Percentile --      Boys Systolic BP Percentile --      Boys Diastolic BP Percentile --      Pulse Rate 10/28/24 1913 92     Resp 10/28/24 1913 18     Temp 10/28/24 1913 98.9 F (37.2 C)     Temp Source 10/28/24 1913 Oral     SpO2 10/28/24 1913 96 %     Weight 10/28/24 1912 240 lb (108.9 kg)     Height 10/28/24 1912 6' 2 (1.88 m)     Head Circumference --      Peak Flow --      Pain Score 10/28/24 1912 0     Pain Loc --      Pain Education --      Exclude from Growth Chart --    No data found.  Updated Vital Signs BP (!) 150/102 (BP Location: Right Arm)   Pulse 92   Temp 98.9 F (37.2 C) (Oral)   Resp 18   Ht 6' 2 (1.88 m)   Wt 240 lb (108.9 kg)   SpO2 96%   BMI 30.81 kg/m     Physical Exam Vitals and nursing note reviewed.   Constitutional:      Appearance: Normal appearance. He is normal weight. He is ill-appearing.  HENT:     Head: Normocephalic and atraumatic.     Right Ear: Tympanic membrane, ear canal and external ear normal.     Left Ear: Tympanic membrane, ear canal and external ear normal.     Nose: Nose normal.     Mouth/Throat:     Mouth: Mucous membranes are moist.     Pharynx: Oropharynx is clear.  Eyes:     Extraocular Movements: Extraocular movements intact.     Conjunctiva/sclera: Conjunctivae normal.     Pupils: Pupils are equal, round, and reactive  to light.  Cardiovascular:     Rate and Rhythm: Normal rate and regular rhythm.     Pulses: Normal pulses.     Heart sounds: Normal heart sounds.  Pulmonary:     Effort: Pulmonary effort is normal.     Breath sounds: Normal breath sounds. No wheezing, rhonchi or rales.     Comments: Frequent nonproductive cough on exam Musculoskeletal:        General: Normal range of motion.     Cervical back: Normal range of motion and neck supple.  Skin:    General: Skin is warm and dry.  Neurological:     General: No focal deficit present.     Mental Status: He is alert and oriented to person, place, and time. Mental status is at baseline.  Psychiatric:        Mood and Affect: Mood normal.        Behavior: Behavior normal.      UC Treatments / Results  Labs (all labs ordered are listed, but only abnormal results are displayed) Labs Reviewed - No data to display  EKG   Radiology No results found.  Procedures Procedures (including critical care time)  Medications Ordered in UC Medications - No data to display  Initial Impression / Assessment and Plan / UC Course  I have reviewed the triage vital signs and the nursing notes.  Pertinent labs & imaging results that were available during my care of the patient were reviewed by me and considered in my medical decision making (see chart for details).     MDM: 1.  Influenza A-Rx'd  Tamiflu  75 mg capsule: Take 1 capsule twice daily x 5 days; 2.  Cough, unspecified type-Rx'd prednisone  20 mg tablet: Take 3 tablets p.o. daily x 5 days, Rx'd Promethazine  DM 6.25-15 mg / 5 mL syrup: Take 5 mL twice daily, as needed for cough. Advised patient take medications as directed with food to completion.  Advised take prednisone  with first dose of Tamiflu  for the next 5 days.  Advised may use Promethazine  DM cough syrup at night prior to sleep for cough due to sedative effects.  Encouraged increase daily water intake to 64 ounces per day while taking these medications.  Advised if symptoms worsen and/or unresolved please follow-up with PCP or here for further evaluation.  Patient discharged home, hemodynamically stable. Final Clinical Impressions(s) / UC Diagnoses   Final diagnoses:  Cough, unspecified type  Influenza A     Discharge Instructions      Advised patient take medications as directed with food to completion.  Advised take prednisone  with first dose of Tamiflu  for the next 5 days.  Advised may use Promethazine  DM cough syrup at night prior to sleep for cough due to sedative effects.  Encouraged increase daily water intake to 64 ounces per day while taking these medications.  Advised if symptoms worsen and/or unresolved please follow-up with PCP or here for further evaluation.     ED Prescriptions     Medication Sig Dispense Auth. Provider   oseltamivir  (TAMIFLU ) 75 MG capsule Take 1 capsule (75 mg total) by mouth every 12 (twelve) hours. 10 capsule Benney [place], FNP   predniSONE  (DELTASONE ) 20 MG tablet Take 3 tabs PO daily x 5 days. 15 tablet Roan Miklos, FNP   promethazine -dextromethorphan (PROMETHAZINE -DM) 6.25-15 MG/5ML syrup Take 5 mLs by mouth 2 (two) times daily as needed for cough. 118 mL Sabiha Sura, FNP      PDMP not reviewed this encounter.    [  1]  Social History Tobacco Use   Smoking status: Former   Smokeless tobacco: Never  Vaping Use    Vaping status: Never Used  Substance Use Topics   Alcohol  use: Yes   Drug use: No     Teddy Sharper, FNP 10/28/24 1942  "

## 2024-11-01 ENCOUNTER — Other Ambulatory Visit: Payer: Self-pay | Admitting: Physician Assistant

## 2024-11-01 DIAGNOSIS — F5101 Primary insomnia: Secondary | ICD-10-CM

## 2024-11-01 DIAGNOSIS — F331 Major depressive disorder, recurrent, moderate: Secondary | ICD-10-CM

## 2024-11-28 ENCOUNTER — Other Ambulatory Visit: Payer: Self-pay | Admitting: Physician Assistant

## 2024-11-28 ENCOUNTER — Telehealth: Payer: Self-pay

## 2024-11-28 ENCOUNTER — Telehealth: Payer: Self-pay | Admitting: Physician Assistant

## 2024-11-28 DIAGNOSIS — G894 Chronic pain syndrome: Secondary | ICD-10-CM

## 2024-11-28 DIAGNOSIS — M47812 Spondylosis without myelopathy or radiculopathy, cervical region: Secondary | ICD-10-CM

## 2024-11-28 DIAGNOSIS — M4802 Spinal stenosis, cervical region: Secondary | ICD-10-CM

## 2024-11-28 NOTE — Telephone Encounter (Signed)
 Other message about a refill on Tramadol .

## 2024-11-28 NOTE — Transitions of Care (Post Inpatient/ED Visit) (Signed)
 "  11/28/2024  Name: Thomas Jennings MRN: [id number] DOB: 1981-03-25  Today's TOC FU Call Status: Today's TOC FU Call Status:: Successful TOC FU Call Completed TOC FU Call Complete Date: 11/28/24  Patient's Name and Date of Birth confirmed. Name, DOB  Transition Care Management Follow-up Telephone Call Date of Discharge: 11/27/24 Discharge Facility: Other [facility]) Name of Other ([facility]) Discharge Facility: [facility] Type of Discharge: Inpatient Admission Primary Inpatient Discharge Diagnosis:: alcohol  use How have you been since you were released from the hospital?: Better Any questions or concerns?: No  Items Reviewed: Did you receive and understand the discharge instructions provided?: Yes Medications obtained,verified, and reconciled?: Yes (Medications Reviewed) Any new allergies since your discharge?: No Dietary orders reviewed?: Yes Do you have support at home?: Yes People in Home [RPT]: significant other  Medications Reviewed Today: Medications Reviewed Today     Reviewed by Emmitt Pan, LPN (Licensed Practical Nurse) on 11/28/24 at 1038  Med List Status: <None>   Medication Order Taking? Sig Documenting Provider Last Dose Status Informant  amLODipine  (NORVASC ) 10 MG tablet [id number] Yes Take 1 tablet (10 mg total) by mouth daily. Breeback, Jade L, PA-C  Active   carvedilol (COREG) 12.5 MG tablet [id number] Yes Take 12.5 mg by mouth. [provider]  Active   DULoxetine  (CYMBALTA ) 60 MG capsule [id number] Yes Take 1 capsule (60 mg total) by mouth 2 (two) times daily. Breeback, Jade L, PA-C  Active   folic acid  (FOLVITE ) 1 MG tablet [id number] Yes Take 1 mg by mouth. [provider]  Active   losartan -hydrochlorothiazide (HYZAAR) 100-25 MG tablet [id number] Yes Take 1 tablet by mouth daily. Breeback, Jade L, PA-C  Active   meloxicam  (MOBIC ) 15 MG tablet [id number] Yes Take 1 tablet (15 mg total) by mouth daily. Breeback, Jade L, PA-C  Active    omeprazole  (PRILOSEC) 40 MG capsule [id number] Yes TAKE 1 CAPSULE (40 MG TOTAL) BY MOUTH IN THE MORNING AND AT BEDTIME. Breeback, Jade L, PA-C  Active   oseltamivir  (TAMIFLU ) 75 MG capsule [id number] Yes Take 1 capsule (75 mg total) by mouth every 12 (twelve) hours. Teddy Sharper, FNP  Active   predniSONE  (DELTASONE ) 20 MG tablet [id number] Yes Take 3 tabs PO daily x 5 days. Ragan, Michael, FNP  Active   pregabalin  (LYRICA ) 150 MG capsule [id number] Yes Take 1 capsule (150 mg total) by mouth 2 (two) times daily. Breeback, Jade L, PA-C  Active   promethazine -dextromethorphan (PROMETHAZINE -DM) 6.25-15 MG/5ML syrup [id number] Yes Take 5 mLs by mouth 2 (two) times daily as needed for cough. Ragan, Michael, FNP  Active   QUEtiapine  (SEROQUEL ) 25 MG tablet [id number] Yes TAKE ONE TABLET FOR THREE DAYS AT BEDTIME THEN INCREASE TO 2 TABLETS AT BEDTIME. Breeback, Jade L, PA-C  Active   thiamine (VITAMIN B1) 100 MG tablet [id number] Yes Take 100 mg by mouth. [provider]  Active   traMADol  (ULTRAM ) 50 MG tablet [id number] Yes Take 1 tablet (50 mg total) by mouth every 8 (eight) hours. Breeback, Jade L, PA-C  Active   Med List Note Colene Vermell CROME, PA-C 11/16/20 1332):              Home Care and Equipment/Supplies: Were Home Health Services Ordered?: NA Any new equipment or medical supplies ordered?: NA  Functional Questionnaire: Do you need assistance with bathing/showering or dressing?: No Do you need assistance with meal preparation?: No Do you need assistance with eating?: No Do you have difficulty maintaining continence: No Do you need  assistance with getting out of bed/getting out of a chair/moving?: No Do you have difficulty managing or taking your medications?: No  Follow up appointments reviewed: PCP Follow-up appointment confirmed?: Yes Date of PCP follow-up appointment?: 12/05/24 Follow-up Provider: [facility] Follow-up appointment confirmed?: NA Do you  need transportation to your follow-up appointment?: No Do you understand care options if your condition(s) worsen?: Yes-patient verbalized understanding    SIGNATURE Julian Lemmings, LPN [facility] Nurse Health Advisor Direct Dial [phone]  "

## 2024-11-28 NOTE — Telephone Encounter (Unsigned)
 Copied from CRM #[id number]. Topic: Clinical - Medication Refill >> Nov 28, 2024 10:44 AM Olam RAMAN wrote: Medication: traMADol  (ULTRAM ) 50 MG tablet   Has the patient contacted their pharmacy? Yes (Agent: If no, request that the patient contact the pharmacy for the refill. If patient does not wish to contact the pharmacy document the reason why and proceed with request.) (Agent: If yes, when and what did the pharmacy advise?)  This is the patient's preferred pharmacy:  [id number] - [place], [place] - [address] [place] [address] Phone: [phone] Fax: [phone]  Is this the correct pharmacy for this prescription? Yes If no, delete pharmacy and type the correct one.   Has the prescription been filled recently? Yes  Is the patient out of the medication? Yes  Has the patient been seen for an appointment in the last year OR does the patient have an upcoming appointment? Yes  Can we respond through MyChart? Yes  Agent: Please be advised that Rx refills may take up to 3 business days. We ask that you follow-up with your pharmacy.

## 2024-11-28 NOTE — Telephone Encounter (Signed)
 Recent hospital visit for alcohol  dependence breaks contract for tramadol . Not going to be able to refill at this time.

## 2024-11-28 NOTE — Telephone Encounter (Signed)
 See other telephone encounter.

## 2024-11-28 NOTE — Telephone Encounter (Signed)
 Copied from CRM #[id number]. Topic: Clinical - Refused Triage >> Nov 28, 2024 10:47 AM Olam RAMAN wrote: Patient/caller voiced complaints of Pt has neck and back pain. Declined transfer to triage.   If patient is unestablished, route message to [facility] Nurse Triage If patient is established, route message to the appropriate department clinical pool Clinic closed to call

## 2024-11-28 NOTE — Telephone Encounter (Signed)
 Patient advised

## 2024-12-05 ENCOUNTER — Encounter: Payer: Self-pay | Admitting: Physician Assistant

## 2024-12-05 ENCOUNTER — Inpatient Hospital Stay: Payer: MEDICAID | Admitting: Physician Assistant

## 2024-12-05 VITALS — BP 130/78

## 2024-12-05 DIAGNOSIS — F5101 Primary insomnia: Secondary | ICD-10-CM | POA: Diagnosis not present

## 2024-12-05 DIAGNOSIS — F1011 Alcohol abuse, in remission: Secondary | ICD-10-CM | POA: Diagnosis not present

## 2024-12-05 DIAGNOSIS — I10 Essential (primary) hypertension: Secondary | ICD-10-CM | POA: Insufficient documentation

## 2024-12-05 DIAGNOSIS — F331 Major depressive disorder, recurrent, moderate: Secondary | ICD-10-CM

## 2024-12-05 DIAGNOSIS — G894 Chronic pain syndrome: Secondary | ICD-10-CM

## 2024-12-05 DIAGNOSIS — M47812 Spondylosis without myelopathy or radiculopathy, cervical region: Secondary | ICD-10-CM | POA: Diagnosis not present

## 2024-12-05 DIAGNOSIS — M4802 Spinal stenosis, cervical region: Secondary | ICD-10-CM | POA: Diagnosis not present

## 2024-12-05 MED ORDER — LOSARTAN POTASSIUM-HCTZ 100-25 MG PO TABS: 1.0000 | ORAL_TABLET | Freq: Every day | ORAL | 1 refills | Status: AC

## 2024-12-05 MED ORDER — TRAMADOL HCL 50 MG PO TABS: 50.0000 mg | ORAL_TABLET | Freq: Three times a day (TID) | ORAL | 0 refills | Status: AC

## 2024-12-05 NOTE — Progress Notes (Signed)
 ..Virtual Visit via Video Note  I connected with Thomas Jennings on 12/05/24 at 10:10 AM EST by a video enabled telemedicine application and verified that I am speaking with the correct person using two identifiers.  Location: Patient: home Provider: home  .SABRAParticipating in visit:  Patient: Thomas Jennings Provider: Vermell Bologna PA-C Provider in training: Annitta Raw PA-S   I discussed the limitations of evaluation and management by telemedicine and the availability of in person appointments. The patient expressed understanding and agreed to proceed.  History of Present Illness: .Discussed the use of AI scribe software for clinical note transcription with the patient, who gave verbal consent to proceed.  History of Present Illness Thomas Jennings is a 44 year old male who presents for follow-up after two emergency department visits for alcohol  intoxication in the last 10 days.   Alcohol  use disorder - Two recent emergency department visits for alcohol  intoxication: November 25, 2024 and December 01, 2024 - Longest period of sobriety has been seven months - Received occupational therapy and outpatient therapy discussion during first visit - Received [facility] consult and resources for Alcoholics Anonymous and twelve steps program during second visit - Currently participating in outpatient alcohol  abuse program at [facility] from 2 PM to 5 PM - Plans to continue outpatient program for several weeks - Has been given resources with an Alcoholics Anonymous sponsor  Medication adherence and psychiatric symptoms - Had been out of risperidone  and tramadol  for a week prior to relapse - Lack of medication contributed to depression and pain - Risperidone  refilled during hospital stay - Given a few tramadol  in hospital, which helped with pain  Hypertension - Currently taking carvedilol, Hyzaar, and amlodipine  for blood pressure control - No chest pain, palpitations, or  headaches  Sleep disturbance - Started melatonin at night for sleep  Occupational status - Preparing to start a third shift job - Plans to work part-time, averaging 30-32 hours per week      Observations/Objective: No acute distress Normal mood and appearance Normal breathing  . Today's Vitals   12/05/24 1034  BP: 130/78   There is no height or weight on file to calculate BMI.    Assessment and Plan: .Thomas Jennings was seen today for hospitalization follow-up.  Diagnoses and all orders for this visit:  Alcohol  use disorder, mild, in early remission, abuse  Cervical spinal stenosis -     traMADol  (ULTRAM ) 50 MG tablet; Take 1 tablet (50 mg total) by mouth every 8 (eight) hours.  Cervical spondylosis without myelopathy -     traMADol  (ULTRAM ) 50 MG tablet; Take 1 tablet (50 mg total) by mouth every 8 (eight) hours.  Chronic pain syndrome -     traMADol  (ULTRAM ) 50 MG tablet; Take 1 tablet (50 mg total) by mouth every 8 (eight) hours.  Essential hypertension -     losartan -hydrochlorothiazide (HYZAAR) 100-25 MG tablet; Take 1 tablet by mouth daily.  Moderate episode of recurrent major depressive disorder [facility])   Assessment & Plan Alcohol  use disorder Recent relapse after two ED visits for intoxication. Longest sobriety seven months. Attending outpatient program. Aware of alcohol  risks with tramadol . - Continue outpatient program at [facility] from 2 PM to 5 PM. - Establish contact with an AA sponsor. - Refilled tramadol  with condition of monthly urine drug screen.  Chronic pain syndrome Pain exacerbated by running out of tramadol . Improved with tramadol  refill. Aware of alcohol  risk with tramadol . - Refilled tramadol  prescription. - Will need UDS in one month  to get next prescription - Continue cymbalta , lyrica , and mobic .  Essential hypertension Previously uncontrolled hypertension improved to 130/85 with current regimen. No chest pain, palpitations, or headaches. -  Continue current antihypertensive regimen with carvedilol, Hyzaar, and amlodipine .  Primary insomnia Started melatonin for sleep. Girlfriend organizing medications. - Continue melatonin for sleep.      Follow Up Instructions:    I discussed the assessment and treatment plan with the patient. The patient was provided an opportunity to ask questions and all were answered. The patient agreed with the plan and demonstrated an understanding of the instructions.   The patient was advised to call back or seek an in-person evaluation if the symptoms worsen or if the condition fails to improve as anticipated.  I provided 30 minutes of non-face-to-face time during this encounter.   Thomas Jiles, PA-C

## 2025-01-03 ENCOUNTER — Ambulatory Visit: Payer: MEDICAID | Admitting: Physician Assistant
